# Patient Record
Sex: Female | Born: 1945 | Race: White | Hispanic: No | Marital: Married | Smoking: Never smoker
Health system: Southern US, Community
[De-identification: ages and names within clinical notes are randomized; demographics above are authoritative.]

## PROBLEM LIST (undated history)

## (undated) DIAGNOSIS — R413 Other amnesia: Secondary | ICD-10-CM

## (undated) DIAGNOSIS — F0390 Unspecified dementia without behavioral disturbance: Secondary | ICD-10-CM

## (undated) DIAGNOSIS — K5730 Diverticulosis of large intestine without perforation or abscess without bleeding: Secondary | ICD-10-CM

## (undated) DIAGNOSIS — H409 Unspecified glaucoma: Secondary | ICD-10-CM

## (undated) DIAGNOSIS — E785 Hyperlipidemia, unspecified: Secondary | ICD-10-CM

## (undated) DIAGNOSIS — J329 Chronic sinusitis, unspecified: Secondary | ICD-10-CM

## (undated) HISTORY — DX: Chronic sinusitis, unspecified: J32.9

## (undated) HISTORY — DX: Unspecified glaucoma: H40.9

## (undated) HISTORY — DX: Hyperlipidemia, unspecified: E78.5

## (undated) HISTORY — PX: NEPHRECTOMY: SHX65

## (undated) HISTORY — DX: Other amnesia: R41.3

## (undated) HISTORY — DX: Diverticulosis of large intestine without perforation or abscess without bleeding: K57.30

## (undated) HISTORY — PX: COLONOSCOPY: SHX174

---

## 2001-05-26 ENCOUNTER — Other Ambulatory Visit: Admission: RE | Admit: 2001-05-26 | Discharge: 2001-05-26 | Payer: Self-pay | Admitting: Obstetrics and Gynecology

## 2002-06-05 ENCOUNTER — Other Ambulatory Visit: Admission: RE | Admit: 2002-06-05 | Discharge: 2002-06-05 | Payer: Self-pay | Admitting: Obstetrics and Gynecology

## 2003-03-20 DIAGNOSIS — K5730 Diverticulosis of large intestine without perforation or abscess without bleeding: Secondary | ICD-10-CM

## 2003-03-20 HISTORY — DX: Diverticulosis of large intestine without perforation or abscess without bleeding: K57.30

## 2003-06-07 ENCOUNTER — Other Ambulatory Visit: Admission: RE | Admit: 2003-06-07 | Discharge: 2003-06-07 | Payer: Self-pay | Admitting: Obstetrics and Gynecology

## 2003-10-06 ENCOUNTER — Ambulatory Visit (HOSPITAL_COMMUNITY): Admission: RE | Admit: 2003-10-06 | Discharge: 2003-10-06 | Payer: Self-pay | Admitting: Gastroenterology

## 2004-06-01 ENCOUNTER — Other Ambulatory Visit: Admission: RE | Admit: 2004-06-01 | Discharge: 2004-06-01 | Payer: Self-pay | Admitting: Obstetrics and Gynecology

## 2005-06-04 ENCOUNTER — Other Ambulatory Visit: Admission: RE | Admit: 2005-06-04 | Discharge: 2005-06-04 | Payer: Self-pay | Admitting: Obstetrics & Gynecology

## 2006-06-13 ENCOUNTER — Other Ambulatory Visit: Admission: RE | Admit: 2006-06-13 | Discharge: 2006-06-13 | Payer: Self-pay | Admitting: Obstetrics & Gynecology

## 2007-06-16 ENCOUNTER — Other Ambulatory Visit: Admission: RE | Admit: 2007-06-16 | Discharge: 2007-06-16 | Payer: Self-pay | Admitting: Obstetrics and Gynecology

## 2010-08-04 NOTE — Op Note (Signed)
NAME:  Sharon Henry, Sharon Henry                        ACCOUNT NO.:  [id number]   MEDICAL RECORD NO.:  [id number]                   PATIENT TYPE:  AMB   LOCATION:  ENDO                                 FACILITY:  [facility]   PHYSICIAN:  Anselmo Rod, M.D.               DATE OF BIRTH:  08-12-1945   DATE OF PROCEDURE:  10/06/2003  DATE OF DISCHARGE:                                 OPERATIVE REPORT   PROCEDURE PERFORMED:  Screening colonoscopy.   ENDOSCOPIST:  Anselmo Rod, M.D.   INSTRUMENT USED:  Olympus video colonoscope.   INDICATION FOR PROCEDURE:  A 65 year old white female undergoing screening  colonoscopy to rule out colonic polyps, masses, etc.   PREPROCEDURE PREPARATION:  Informed consent was secured from the patient.  The patient was fasted for 8 hours prior to the procedure and prepped with a  bottle of magnesium citrate and gallon of GoLYTELY the night prior to the  procedure.   PREPROCEDURE PHYSICAL:  The patient had stable vital signs.  Neck supple.  Chest clear to auscultation.  S1, S2 regular.  Abdomen soft with normal  bowel sounds.   DESCRIPTION OF PROCEDURE:  The patient was placed in the left lateral  decubitus position, sedated with 70 mg of Demerol and 7.5 mg of  Versed in  slow incremental doses.  Once the patient was adequately sedated, maintained  on low-flow oxygen, and continuous cardiac monitoring, the Olympus video  colonoscope was advanced from the rectum to the cecum.  There was some  resistance in the right colon.  Multiple washings were done.  A small single  isolated diverticulum was seen in the sigmoid colon.  No other abnormalities  were identified.  The patient tolerated the procedure well without immediate  complications.   IMPRESSION:  1. Isolated diverticulum in the sigmoid colon (small).  2. No masses or polyps seen.   RECOMMENDATIONS:  1. Continue high-fiber diet with liberal fluid intake.  2. Repeat colonoscopy in the next 10 years unless  the patient develops any     abnormal symptoms in the interim.                                               Anselmo Rod, M.D.    JNM/MEDQ  D:  10/06/2003  T:  10/06/2003  Job:  [id number]   cc:   Laqueta Linden, M.D.  [address]  [place]  [place] [address]  Fax: [phone]

## 2010-09-01 ENCOUNTER — Encounter: Payer: Self-pay | Admitting: Internal Medicine

## 2010-09-01 ENCOUNTER — Ambulatory Visit (INDEPENDENT_AMBULATORY_CARE_PROVIDER_SITE_OTHER): Payer: BC Managed Care – PPO | Admitting: Internal Medicine

## 2010-09-01 VITALS — BP 120/64 | HR 60 | Ht 62.0 in | Wt 138.0 lb

## 2010-09-01 DIAGNOSIS — K5900 Constipation, unspecified: Secondary | ICD-10-CM | POA: Insufficient documentation

## 2010-09-01 DIAGNOSIS — R195 Other fecal abnormalities: Secondary | ICD-10-CM | POA: Insufficient documentation

## 2010-09-01 MED ORDER — PEG-KCL-NACL-NASULF-NA ASC-C 100 G PO SOLR: 1.0000 | Freq: Once | ORAL | Status: DC

## 2010-09-01 NOTE — Progress Notes (Signed)
  Subjective:    Patient ID: Sharon Henry, female    DOB: 1945/07/21, 65 y.o.   MRN: [id number]  HPI 65 year old white woman with several month history of worsening constipation with infrequent defecation and difficulty producing a stool. Prior to this she was fairly regular. She says she eats a fair amount of fiber in her diet. She has been using some bisacodyl with some relief. She has seen some bright red blood per rectum on the toilet paper at times. She had an immune fecal occult blood test that was positive. Her hemoglobin is normal. She had a colonoscopy for screening in 2005 diverticulosis. Denies any significant change in activity, medications or diet associated with this. Her TSH has been normal as well.  GI review of systems otherwise negative.  Past Medical History  Diagnosis Date  . HLD (hyperlipidemia)   . Allergic rhinitis   . Sinusitis   . Diverticulosis of sigmoid colon 2005     small    Past Surgical History  Procedure Date  . Nephrectomy     Left   . Colonoscopy 2005    diverticulosis    reports that she has never smoked. She has never used smokeless tobacco. She reports that she drinks alcohol. She reports that she does not use illicit drugs. family history includes COPD in her mother; Diabetes in her brother; Heart attack (age of onset:50) in her sister; Heart attack (age of onset:52) in her father; and Ovarian cancer in her maternal aunt.  There is no history of Colon cancer. Allergies  Allergen Reactions  . Penicillins         Review of Systems Positive for allergies some night sweats and insomnia all other review of systems negative or as per history of present illness    Objective:   Physical Exam  Constitutional: She is oriented to person, place, and time. She appears well-developed and well-nourished. No distress.  HENT:  Head: Normocephalic and atraumatic.  Mouth/Throat: Oropharynx is clear and moist.  Eyes: Conjunctivae are normal. Pupils are  equal, round, and reactive to light. No scleral icterus.  Neck: Normal range of motion. Neck supple. No thyromegaly present.  Cardiovascular: Normal rate, regular rhythm and normal heart sounds.  Exam reveals no gallop and no friction rub.   No murmur heard. Pulmonary/Chest: Effort normal and breath sounds normal.  Abdominal: Soft. Bowel sounds are normal. She exhibits no distension and no mass. There is no tenderness. There is no guarding.       No hepatosplenomegaly  Genitourinary:       Rectal deferred until colonoscopy  Musculoskeletal: She exhibits no edema.  Lymphadenopathy:    She has no cervical adenopathy.  Neurological: She is alert and oriented to person, place, and time.  Skin: Skin is warm and dry.  Psychiatric: She has a normal mood and affect. Her behavior is normal.          Assessment & Plan:

## 2010-09-01 NOTE — Assessment & Plan Note (Signed)
Most likely functional, could be diverticulosis. Colorectal neoplasia as a cause not yet excluded. Await the colonoscopy. In the meantime she can add MiraLax on a daily basis to her regimen and continue the intermittent bisacodyl. TSH is normal as are other electrolytes. She may need even more fiber in her diet, await the investigations.

## 2010-09-01 NOTE — Assessment & Plan Note (Addendum)
This is in the setting of some rectal bleeding. Also having constipation. Anorectal problems such as hemorrhoids are most likely but given the overall picture needs a colonoscopy to exclude colorectal neoplasia. Risks benefits and indications of colonoscopy are explained she understands and agrees to proceed. CBC is normal in April 2012.

## 2010-09-01 NOTE — Patient Instructions (Signed)
You have been scheduled for a Colonoscopy with separate instructions given. Your prep kit has been sent to your pharmacy for you to pick up. Please start Miralax OTC 1 dose every day in 8 oz of liquid.

## 2010-09-14 ENCOUNTER — Ambulatory Visit (AMBULATORY_SURGERY_CENTER): Payer: BC Managed Care – PPO | Admitting: Internal Medicine

## 2010-09-14 ENCOUNTER — Other Ambulatory Visit: Payer: BC Managed Care – PPO | Admitting: Internal Medicine

## 2010-09-14 ENCOUNTER — Encounter: Payer: Self-pay | Admitting: Internal Medicine

## 2010-09-14 VITALS — BP 102/59 | HR 52 | Temp 97.2°F | Resp 20 | Ht 62.0 in | Wt 138.0 lb

## 2010-09-14 DIAGNOSIS — R195 Other fecal abnormalities: Secondary | ICD-10-CM

## 2010-09-14 DIAGNOSIS — K5730 Diverticulosis of large intestine without perforation or abscess without bleeding: Secondary | ICD-10-CM

## 2010-09-14 DIAGNOSIS — K648 Other hemorrhoids: Secondary | ICD-10-CM

## 2010-09-14 MED ORDER — SODIUM CHLORIDE 0.9 % IV SOLN: 500.0000 mL | INTRAVENOUS | Status: DC

## 2010-09-14 NOTE — Progress Notes (Signed)
PT STATES SHE RUNS A LOW HEART RATE AND BP AT ALL TIMES-E [facility] RN

## 2010-09-14 NOTE — Patient Instructions (Signed)
Discharge instructions given with verbal understanding. Handouts on diverticulosis and hemorrhoids given. Resume previous medications.

## 2010-09-15 ENCOUNTER — Telehealth: Payer: Self-pay | Admitting: *Deleted

## 2010-09-15 NOTE — Telephone Encounter (Signed)

## 2012-12-17 ENCOUNTER — Other Ambulatory Visit: Payer: Self-pay

## 2012-12-19 ENCOUNTER — Other Ambulatory Visit (INDEPENDENT_AMBULATORY_CARE_PROVIDER_SITE_OTHER): Payer: Medicare Other

## 2012-12-19 DIAGNOSIS — E785 Hyperlipidemia, unspecified: Secondary | ICD-10-CM

## 2012-12-19 NOTE — Progress Notes (Signed)
Patient came in for labs obnly

## 2012-12-20 LAB — CMP14+EGFR
ALT: 22 IU/L (ref 0–32)
AST: 29 IU/L (ref 0–40)
Alkaline Phosphatase: 87 IU/L (ref 39–117)
CO2: 28 mmol/L (ref 18–29)
Calcium: 10.4 mg/dL — ABNORMAL HIGH (ref 8.6–10.2)
Chloride: 99 mmol/L (ref 97–108)
Creatinine, Ser: 0.92 mg/dL (ref 0.57–1.00)
Potassium: 4.8 mmol/L (ref 3.5–5.2)
Sodium: 141 mmol/L (ref 134–144)

## 2012-12-20 LAB — NMR, LIPOPROFILE
HDL Cholesterol by NMR: 95 mg/dL (ref >=40)
LDL Particle Number: 1689 nmol/L — ABNORMAL HIGH (ref <1000)
LDLC SERPL CALC-MCNC: 143 mg/dL — ABNORMAL HIGH (ref <100)
LP-IR Score: <25 (ref <=45)

## 2012-12-22 ENCOUNTER — Other Ambulatory Visit: Payer: Self-pay | Admitting: Nurse Practitioner

## 2012-12-22 MED ORDER — ATORVASTATIN CALCIUM 40 MG PO TABS: 40.0000 mg | ORAL_TABLET | Freq: Every day | ORAL | Status: DC

## 2012-12-24 ENCOUNTER — Ambulatory Visit: Payer: Self-pay | Admitting: Family Medicine

## 2012-12-25 ENCOUNTER — Ambulatory Visit (INDEPENDENT_AMBULATORY_CARE_PROVIDER_SITE_OTHER): Payer: Medicare Other | Admitting: Family Medicine

## 2012-12-25 ENCOUNTER — Encounter: Payer: Self-pay | Admitting: Family Medicine

## 2012-12-25 VITALS — BP 122/70 | HR 53 | Temp 97.5°F | Ht 62.0 in | Wt 151.0 lb

## 2012-12-25 DIAGNOSIS — R413 Other amnesia: Secondary | ICD-10-CM

## 2012-12-25 DIAGNOSIS — E785 Hyperlipidemia, unspecified: Secondary | ICD-10-CM

## 2012-12-25 DIAGNOSIS — R5381 Other malaise: Secondary | ICD-10-CM

## 2012-12-25 DIAGNOSIS — Z23 Encounter for immunization: Secondary | ICD-10-CM

## 2012-12-25 MED ORDER — ATORVASTATIN CALCIUM 20 MG PO TABS: 20.0000 mg | ORAL_TABLET | Freq: Every day | ORAL | Status: DC

## 2012-12-25 NOTE — Progress Notes (Signed)
  Subjective:    Patient ID: Sharon Henry, female    DOB: [birth year], 67 y.o.   MRN: [id number]  HPI  .This 67 y.o. female presents for evaluation of forgetfulness. She is accompanied by her husband who states she has been Having memory problems.  Review of Systems C/o memory problems.   No chest pain, SOB, HA, dizziness, vision change, N/V, diarrhea, constipation, dysuria, urinary urgency or frequency, myalgias, arthralgias or rash.  Objective:   Physical Exam  Vital signs noted  Well developed well nourished female.  HEENT - Head atraumatic Normocephalic                Eyes - PERRLA, Conjuctiva - clear Sclera- Clear EOMI                Ears - EAC's Wnl TM's Wnl Gross Hearing WNL                Nose - Nares patent                 Throat - oropharanx wnl Respiratory - Lungs CTA bilateral Cardiac - RRR S1 and S2 without murmur GI - Abdomen soft Nontender and bowel sounds active x 4 Extremities - No edema. Neuro - Grossly intact.  MMSE - 16 which shows moderate cognitive impairment.    Assessment & Plan:  1. Need for prophylactic vaccination and inoculation against influenza Flu shot  2. Memory loss Refer to Neurology and have her worked up.  Discussed this could be possibly dementia and will Defer any tx to neurology. - Ambulatory referral to Neurology - Thyroid Panel With TSH - Vitamin B12 - Vit D  25 hydroxy (rtn osteoporosis monitoring)  3. Other and unspecified hyperlipidemia Continue current regimen - Ambulatory referral to Neurology - atorvastatin (LIPITOR) 20 MG tablet; Take 1 tablet (20 mg total) by mouth daily.  Dispense: 90 tablet; Refill: 3  4. Other malaise and fatigue Check labs and follow up in 3 months or sooner if sx's worsen. - Ambulatory referral to Neurology - Thyroid Panel With TSH - Vitamin B12 - Vit D  25 hydroxy (rtn osteoporosis monitoring)  Deatra Canter FNP

## 2012-12-25 NOTE — Patient Instructions (Signed)
Dementia Dementia is a general term for problems with brain function. A person with dementia has memory loss and a hard time with at least one other brain function such as thinking, speaking, or problem solving. Dementia can affect social functioning, how you do your job, your mood, or your personality. The changes may be hidden for a long time. The earliest forms of this disease are usually not detected by family or friends. Dementia can be:  Irreversible.  Potentially reversible.  Partially reversible.  Progressive. This means it can get worse over time. CAUSES  Irreversible dementia causes may include:  Degeneration of brain cells (Alzheimer's disease or lewy body dementia).  Multiple small strokes (vascular dementia).  Infection (chronic meningitis or Creutzfelt-Jakob disease).  Frontotemporal dementia. This affects younger people, age 40 to 70, compared to those who have Alzheimer's disease.  Dementia associated with other disorders like Parkinson's disease, Huntington's disease, or HIV-associated dementia. Potentially or partially reversible dementia causes may include:  Medicines.  Metabolic causes such as excessive alcohol intake, vitamin B12 deficiency, or thyroid disease.  Masses or pressure in the brain such as a tumor, blood clot, or hydrocephalus. SYMPTOMS  Symptoms are often hard to detect. Family members or coworkers may not notice them early in the disease process. Different people with dementia may have different symptoms. Symptoms can include:  A hard time with memory, especially recent memory. Long-term memory may not be impaired.  Asking the same question multiple times or forgetting something someone just said.  A hard time speaking your thoughts or finding certain words.  A hard time solving problems or performing familiar tasks (such as how to use a telephone).  Sudden changes in mood.  Changes in personality, especially increasing moodiness or  mistrust.  Depression.  A hard time understanding complex ideas that were never a problem in the past. DIAGNOSIS  There are no specific tests for dementia.   Your caregiver may recommend a thorough evaluation. This is because some forms of dementia can be reversible. The evaluation will likely include a physical exam and getting a detailed history from you and a family member. The history often gives the best clues and suggestions for a diagnosis.  Memory testing may be done. A detailed brain function evaluation called neuropsychologic testing may be helpful.  Lab tests and brain imaging (such as a CT scan or MRI scan) are sometimes important.  Sometimes observation and re-evaluation over time is very helpful. TREATMENT  Treatment depends on the cause.   If the problem is a vitamin deficiency, it may be helped or cured with supplements.  For dementias such as Alzheimer's disease, medicines are available to stabilize or slow the course of the disease. There are no cures for this type of dementia.  Your caregiver can help direct you to groups, organizations, and other caregivers to help with decisions in the care of you or your loved one. HOME CARE INSTRUCTIONS The care of individuals with dementia is varied and dependent upon the progression of the dementia. The following suggestions are intended for the person living with, or caring for, the person with dementia.  Create a safe environment.  Remove the locks on bathroom doors to prevent the person from accidentally locking himself or herself in.  Use childproof latches on kitchen cabinets and any place where cleaning supplies, chemicals, or alcohol are kept.  Use childproof covers in unused electrical outlets.  Install childproof devices to keep doors and windows secured.  Remove stove knobs or install safety   knobs and an automatic shut-off on the stove.  Lower the temperature on water heaters.  Label medicines and keep them  locked up.  Secure knives, lighters, matches, power tools, and guns, and keep these items out of reach.  Keep the house free from clutter. Remove rugs or anything that might contribute to a fall.  Remove objects that might break and hurt the person.  Make sure lighting is good, both inside and outside.  Install grab rails as needed.  Use a monitoring device to alert you to falls or other needs for help.  Reduce confusion.  Keep familiar objects and people around.  Use night lights or dim lights at night.  Label items or areas.  Use reminders, notes, or directions for daily activities or tasks.  Keep a simple, consistent routine for waking, meals, bathing, dressing, and bedtime.  Create a calm, quiet environment.  Place large clocks and calendars prominently.  Display emergency numbers and home address near all telephones.  Use cues to establish different times of the day. An example is to open curtains to let the natural light in during the day.   Use effective communication.  Choose simple words and short sentences.  Use a gentle, calm tone of voice.  Be careful not to interrupt.  If the person is struggling to find a word or communicate a thought, try to provide the word or thought.  Ask one question at a time. Allow the person ample time to answer questions. Repeat the question again if the person does not respond.  Reduce nighttime restlessness.  Provide a comfortable bed.  Have a consistent nighttime routine.  Ensure a regular walking or physical activity schedule. Involve the person in daily activities as much as possible.  Limit napping during the day.  Limit caffeine.  Attend social events that stimulate rather than overwhelm the senses.  Encourage good nutrition and hydration.  Reduce distractions during meal times and snacks.  Avoid foods that are too hot or too cold.  Monitor chewing and swallowing ability.  Continue with routine vision,  hearing, dental, and medical screenings.  Only give over-the-counter or prescription medicines as directed by the caregiver.  Monitor driving abilities. Do not allow the person to drive when safe driving is no longer possible.  Register with an identification program which could provide location assistance in the event of a missing person situation. SEEK MEDICAL CARE IF:   New behavioral problems start such as moodiness, aggressiveness, or seeing things that are not there (hallucinations).  Any new problem with brain function happens. This includes problems with balance, speech, or falling a lot.  Problems with swallowing develop.  Any symptoms of other illness happen. Small changes or worsening in any aspect of brain function can be a sign that the illness is getting worse. It can also be a sign of another medical illness such as infection. Seeing a caregiver right away is important. SEEK IMMEDIATE MEDICAL CARE IF:   A fever develops.  New or worsened confusion develops.  New or worsened sleepiness develops.  Staying awake becomes hard to do. Document Released: 08/29/2000 Document Revised: 05/28/2011 Document Reviewed: 07/31/2010 [employer] Patient Information 2014 [employer].  

## 2012-12-26 LAB — THYROID PANEL WITH TSH
Free Thyroxine Index: 2.2 (ref 1.2–4.9)
T3 Uptake Ratio: 30 % (ref 24–39)
T4, Total: 7.2 ug/dL (ref 4.5–12.0)
TSH: 1.63 u[IU]/mL (ref 0.450–4.500)

## 2012-12-26 LAB — VITAMIN B12: Vitamin B-12: 1085 pg/mL — ABNORMAL HIGH (ref 211–946)

## 2012-12-26 LAB — VITAMIN D 25 HYDROXY (VIT D DEFICIENCY, FRACTURES): Vit D, 25-Hydroxy: 69.9 ng/mL (ref 30.0–100.0)

## 2013-01-01 ENCOUNTER — Encounter: Payer: Self-pay | Admitting: *Deleted

## 2013-01-05 ENCOUNTER — Telehealth: Payer: Self-pay | Admitting: Family Medicine

## 2013-01-07 ENCOUNTER — Encounter: Payer: Self-pay | Admitting: Neurology

## 2013-01-07 ENCOUNTER — Ambulatory Visit (INDEPENDENT_AMBULATORY_CARE_PROVIDER_SITE_OTHER): Payer: Medicare Other | Admitting: Neurology

## 2013-01-07 ENCOUNTER — Encounter (INDEPENDENT_AMBULATORY_CARE_PROVIDER_SITE_OTHER): Payer: Self-pay

## 2013-01-07 VITALS — BP 134/71 | HR 53 | Ht 62.0 in | Wt 151.0 lb

## 2013-01-07 DIAGNOSIS — R413 Other amnesia: Secondary | ICD-10-CM

## 2013-01-07 HISTORY — DX: Other amnesia: R41.3

## 2013-01-07 MED ORDER — DONEPEZIL HCL 5 MG PO TABS: 5.0000 mg | ORAL_TABLET | Freq: Every day | ORAL | Status: DC
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
## 2013-01-07 NOTE — Patient Instructions (Signed)
Began Aricept (donepezil) at 5 mg at night for one month. If this medication is well-tolerated, please call our office and we will call in a prescription for the 10 mg tablets. Look out for side effects that may include nausea, diarrhea, weight loss, or stomach cramps. This medication will also cause a runny nose, therefore there is no need for allergy medications for this purpose.    

## 2013-01-21 ENCOUNTER — Ambulatory Visit
Admission: RE | Admit: 2013-01-21 | Discharge: 2013-01-21 | Disposition: A | Discharge status: Home / Self Care | Payer: Medicare Other | Source: Ambulatory Visit | Attending: Neurology | Admitting: Neurology

## 2013-01-21 ENCOUNTER — Telehealth: Payer: Self-pay | Admitting: Neurology

## 2013-01-21 DIAGNOSIS — R413 Other amnesia: Secondary | ICD-10-CM

## 2013-01-21 NOTE — Telephone Encounter (Signed)
I called the patient. The MRI of the brain was normal. 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

## 2013-01-26 ENCOUNTER — Telehealth: Payer: Self-pay | Admitting: Neurology

## 2013-01-26 MED ORDER — DONEPEZIL HCL 10 MG PO TABS: 10.0000 mg | ORAL_TABLET | Freq: Every day | ORAL | Status: DC

## 2013-01-26 NOTE — Telephone Encounter (Signed)
Spoke with husband and he said wife has been on Aricept 5mg  for about 3 weeks now without any side effects.  He wanted to know if the doctor could increase it to 10mg  as discussed in appointment.  [phone]  He also wanted me to document that the patients son Lady Saucier is allowed access to the patient's medical information.  I told him we would mail a HIPAA form for him to fill out and return to the office.

## 2013-01-26 NOTE — Telephone Encounter (Signed)
I called the patient. I talked with the husband. I will go up on the aricept to the 10 mg dose. His son, Lady Saucier, wants to talk to [place], and the husband indicates that it is OK to do so.

## 2013-03-23 ENCOUNTER — Ambulatory Visit (INDEPENDENT_AMBULATORY_CARE_PROVIDER_SITE_OTHER): Payer: Medicare HMO | Admitting: General Practice

## 2013-03-23 VITALS — BP 111/65 | HR 92 | Temp 97.6°F | Ht 62.0 in | Wt 141.0 lb

## 2013-03-23 DIAGNOSIS — R0989 Other specified symptoms and signs involving the circulatory and respiratory systems: Secondary | ICD-10-CM

## 2013-03-23 DIAGNOSIS — J101 Influenza due to other identified influenza virus with other respiratory manifestations: Secondary | ICD-10-CM

## 2013-03-23 DIAGNOSIS — J111 Influenza due to unidentified influenza virus with other respiratory manifestations: Secondary | ICD-10-CM

## 2013-03-23 LAB — POCT INFLUENZA A/B
INFLUENZA A, POC: POSITIVE
INFLUENZA B, POC: NEGATIVE

## 2013-03-23 MED ORDER — OSELTAMIVIR PHOSPHATE 75 MG PO CAPS: 75.0000 mg | ORAL_CAPSULE | Freq: Two times a day (BID) | ORAL | Status: DC

## 2013-03-23 NOTE — Progress Notes (Signed)
   Subjective:    Patient ID: Sharon Henry, female    DOB: [birth year], 68 y.o.   MRN: [id number]  Cough This is a new problem. The current episode started in the past 7 days. The problem has been gradually improving. The cough is non-productive. Associated symptoms include nasal congestion. Pertinent negatives include no chest pain, chills, fever, postnasal drip, sore throat, shortness of breath or wheezing. The symptoms are aggravated by lying down. She has tried OTC cough suppressant for the symptoms. There is no history of asthma, bronchitis or pneumonia.      Review of Systems  Constitutional: Negative for fever and chills.  HENT: Positive for congestion. Negative for postnasal drip, sinus pressure and sore throat.   Respiratory: Positive for cough. Negative for chest tightness, shortness of breath and wheezing.   Cardiovascular: Negative for chest pain and palpitations.  All other systems reviewed and are negative.       Objective:   Physical Exam  Constitutional: She is oriented to person, place, and time. She appears well-developed and well-nourished.  HENT:  Head: Normocephalic and atraumatic.  Right Ear: External ear normal.  Left Ear: External ear normal.  Nose: Nose normal.  Mouth/Throat: Oropharynx is clear and moist.  Eyes: Pupils are equal, round, and reactive to light.  Neck: Normal range of motion. Neck supple. No thyromegaly present.  Cardiovascular: Normal rate, regular rhythm and normal heart sounds.   Pulmonary/Chest: Effort normal and breath sounds normal. No respiratory distress. She exhibits no tenderness.  Lymphadenopathy:    She has no cervical adenopathy.  Neurological: She is alert and oriented to person, place, and time.  Skin: Skin is warm and dry.  Psychiatric: She has a normal mood and affect.     Results for orders placed in visit on 03/23/13  POCT INFLUENZA A/B      Result Value Range   Influenza A, POC Positive     Influenza B, POC  Negative          Assessment & Plan:  1. Chest congestion  - POCT Influenza A/B  2. Influenza A  - oseltamivir (TAMIFLU) 75 MG capsule; Take 1 capsule (75 mg total) by mouth 2 (two) times daily.  Dispense: 10 capsule; Refill: 0 -increase fluids -motrin or tylenol as directed -RTO if symptoms worsen, may seek emergency medical treatment -Patient verbalized understanding Coralie KeensMae E. Shaylee Stanislawski, FNP-C

## 2013-03-23 NOTE — Patient Instructions (Signed)

## 2013-05-08 ENCOUNTER — Telehealth: Payer: Self-pay | Admitting: *Deleted

## 2013-05-08 NOTE — Telephone Encounter (Signed)
Patient husband call about wife medication.

## 2013-05-11 NOTE — Telephone Encounter (Signed)
Spoke to patient's husband and he said that he doesn't see a change with the Aricept.  I explained that it won't make her better but help keep her stable.  He understood and will keep her follow up for 07-15-13, unless he sees a noticeable change.

## 2013-06-25 ENCOUNTER — Other Ambulatory Visit: Payer: Self-pay | Admitting: *Deleted

## 2013-06-25 DIAGNOSIS — E785 Hyperlipidemia, unspecified: Secondary | ICD-10-CM

## 2013-06-25 MED ORDER — ATORVASTATIN CALCIUM 20 MG PO TABS: 20.0000 mg | ORAL_TABLET | Freq: Every day | ORAL | Status: DC

## 2013-07-13 ENCOUNTER — Ambulatory Visit: Payer: Medicare Other | Admitting: Nurse Practitioner

## 2013-07-15 ENCOUNTER — Encounter (INDEPENDENT_AMBULATORY_CARE_PROVIDER_SITE_OTHER): Payer: Self-pay

## 2013-07-15 ENCOUNTER — Encounter: Payer: Self-pay | Admitting: Nurse Practitioner

## 2013-07-15 ENCOUNTER — Ambulatory Visit (INDEPENDENT_AMBULATORY_CARE_PROVIDER_SITE_OTHER): Payer: Medicare HMO | Admitting: Nurse Practitioner

## 2013-07-15 VITALS — BP 118/53 | HR 47 | Ht 63.0 in | Wt 141.0 lb

## 2013-07-15 DIAGNOSIS — R413 Other amnesia: Secondary | ICD-10-CM

## 2013-07-15 MED ORDER — MEMANTINE HCL ER 28 MG PO CP24: 28.0000 mg | ORAL_CAPSULE | Freq: Every day | ORAL | Status: DC
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
## 2013-07-15 NOTE — Patient Instructions (Signed)
Continue continue Aricept at 10 mg Begin Namenda starter pack 1 month free Rx Followup in 6 months

## 2013-07-15 NOTE — Progress Notes (Signed)
I have read the note, and I agree with the clinical assessment and plan.  Joanette Silveria K Alaynah Schutter   

## 2013-09-05 ENCOUNTER — Other Ambulatory Visit: Payer: Self-pay | Admitting: Nurse Practitioner

## 2013-09-07 NOTE — Telephone Encounter (Signed)
Patient's husband calling--patient needs refill called in for generic Namenda--going out of town tomorrow for 12 days--[facility] [place]

## 2013-12-21 ENCOUNTER — Ambulatory Visit (INDEPENDENT_AMBULATORY_CARE_PROVIDER_SITE_OTHER): Payer: Medicare HMO

## 2013-12-21 ENCOUNTER — Telehealth: Payer: Self-pay | Admitting: Family Medicine

## 2013-12-21 DIAGNOSIS — Z23 Encounter for immunization: Secondary | ICD-10-CM

## 2013-12-21 NOTE — Telephone Encounter (Signed)
Dr Christell ConstantMoore isn't taking new patients. Discussed with patient and her husband. Appt scheduled with Dr Hyacinth MeekerMiller for 10/14. They are aware.

## 2013-12-28 ENCOUNTER — Other Ambulatory Visit: Payer: Self-pay

## 2013-12-28 MED ORDER — DONEPEZIL HCL 10 MG PO TABS: 10.0000 mg | ORAL_TABLET | Freq: Every day | ORAL | Status: DC

## 2013-12-30 ENCOUNTER — Encounter: Payer: Self-pay | Admitting: Family Medicine

## 2013-12-30 ENCOUNTER — Encounter (INDEPENDENT_AMBULATORY_CARE_PROVIDER_SITE_OTHER): Payer: Self-pay

## 2013-12-30 ENCOUNTER — Ambulatory Visit (INDEPENDENT_AMBULATORY_CARE_PROVIDER_SITE_OTHER): Payer: Medicare HMO | Admitting: Family Medicine

## 2013-12-30 VITALS — BP 128/59 | HR 46 | Temp 97.6°F | Ht 63.0 in | Wt 127.0 lb

## 2013-12-30 DIAGNOSIS — R413 Other amnesia: Secondary | ICD-10-CM

## 2013-12-30 DIAGNOSIS — Z23 Encounter for immunization: Secondary | ICD-10-CM

## 2013-12-30 NOTE — Progress Notes (Signed)
   Subjective:    Patient ID: Eulas PostLinda M Castiglia, female    DOB: 16-Feb-1946, 68 y.o.   MRN: [id number]  HPI   Patient Active Problem List   Diagnosis Date Noted  . Memory deficit 01/07/2013  . Heme positive stool, immune test 09/01/2010  . Constipation 09/01/2010   Outpatient Encounter Prescriptions as of 12/30/2013  Medication Sig  . Coenzyme Q10 (COQ10 PO) Take by mouth daily.    Marland Kitchen. donepezil (ARICEPT) 10 MG tablet Take 1 tablet (10 mg total) by mouth at bedtime.  . Loratadine-Pseudoephedrine (CLARITIN-D 12 HOUR PO) Take by mouth. As needed   . Multiple Vitamin (MULTIVITAMIN PO) Take by mouth daily.    Marland Kitchen. NAMENDA XR 28 MG CP24 TAKE ONE CAPSULE BY MOUTH ONCE DAILY  . vitamin B-12 (CYANOCOBALAMIN) 1000 MCG tablet Take 1,000 mcg by mouth daily.    . Bisacodyl (LAXATIVE PO) Take by mouth. Vegetable laxative as needed  . [DISCONTINUED] atorvastatin (LIPITOR) 20 MG tablet Take 1 tablet (20 mg total) by mouth daily.  . [DISCONTINUED] oseltamivir (TAMIFLU) 75 MG capsule Take 75 mg by mouth 2 (two) times daily. PRN only      Review of Systems     Objective:   Physical Exam BP 128/59  Pulse 46  Temp(Src) 97.6 F (36.4 C) (Oral)  Ht 5\' 3"  (1.6 m)  Wt 127 lb (57.607 kg)  BMI 22.50 kg/m2        Assessment & Plan:

## 2013-12-30 NOTE — Progress Notes (Signed)
   Subjective:    Patient ID: Sharon Henry M Haak, female    DOB: Nov 11, 1945, 68 y.o.   MRN: [id number]  HPI 68 year old female who is here to followup memory loss. Her husband accompanies her today. He is her primary caregiver and stays with her most of the time. There've been no histories of wandering. She does not drive any longer. We did some simple memory test today. She was unable to her member 3 words at 5 minutes, was unable to draw a clock, was unable to remember her birth year,. Her memory issues started about 2 years ago per the husband. I explained to him that if this is Alzheimer's, it is a fairly aggressive form of dementia with 8-10 year prognosis. I think this is important for him to know for planning purposes.    Review of Systems  Psychiatric/Behavioral: Positive for confusion and decreased concentration.       Objective:   Physical Exam  Constitutional: She appears well-developed.  HENT:  Head: Normocephalic.  Eyes: Pupils are equal, round, and reactive to light.  Cardiovascular: Normal rate and regular rhythm.   Pulmonary/Chest: Effort normal.  Abdominal: Soft.  Musculoskeletal: Normal range of motion.  Neurological: She is alert.  Mini cog failed When talking to pr, she seems aware but when family involved in conversation, her dementia is easily confirmed    BP 128/59  Pulse 46  Temp(Src) 97.6 F (36.4 C) (Oral)  Ht 5\' 3"  (1.6 m)  Wt 127 lb (57.607 kg)  BMI 22.50 kg/m2      Assessment & Plan:  1. Memory deficit Suspect Alzheimer's.  Discussed D/C Aricept; may continue Namenda for now as it helps behaviors sometimes.  Had MRI at neuro office; no vascular changes - Folate B12  Frederica KusterStephen M Jamacia Jester MD - Vitamin B12

## 2013-12-31 LAB — FOLATE: Folate: 12.8 ng/mL (ref >3.0)

## 2013-12-31 LAB — VITAMIN B12: Vitamin B-12: >1999 pg/mL — ABNORMAL HIGH (ref 211–946)

## 2014-01-04 ENCOUNTER — Other Ambulatory Visit: Payer: Self-pay | Admitting: *Deleted

## 2014-01-07 ENCOUNTER — Telehealth: Payer: Self-pay | Admitting: *Deleted

## 2014-01-07 NOTE — Telephone Encounter (Signed)
Message copied by Almeta MonasSTONE, JANIE M on Thu Jan 07, 2014  9:39 AM ------      Message from: Frederica KusterMILLER, STEPHEN M      Created: Wed Jan 06, 2014  8:08 PM       Lab results are negative, eg no cause for dementia ------

## 2014-01-07 NOTE — Telephone Encounter (Signed)
Aware. 

## 2014-01-12 ENCOUNTER — Ambulatory Visit: Payer: Medicare HMO | Admitting: Nurse Practitioner

## 2014-01-25 ENCOUNTER — Other Ambulatory Visit: Payer: Self-pay | Admitting: Family Medicine

## 2014-01-25 MED ORDER — DONEPEZIL HCL 10 MG PO TABS: 10.0000 mg | ORAL_TABLET | Freq: Every day | ORAL | Status: DC

## 2014-01-25 NOTE — Telephone Encounter (Signed)
Message left , script sent in to pharmacy.

## 2014-03-07 ENCOUNTER — Other Ambulatory Visit: Payer: Self-pay | Admitting: Neurology

## 2014-03-08 ENCOUNTER — Other Ambulatory Visit: Payer: Self-pay | Admitting: Family Medicine

## 2014-03-08 NOTE — Telephone Encounter (Signed)
Patient cancelled last appt noting: Patient (per patient's husband, patient is seeing another doctor and does not wish to r/s )

## 2014-03-08 NOTE — Telephone Encounter (Signed)
Was this DC'd, read last notes?

## 2014-03-09 MED ORDER — MEMANTINE HCL ER 28 MG PO CP24: 1.0000 | ORAL_CAPSULE | Freq: Every day | ORAL | Status: DC

## 2014-03-09 NOTE — Telephone Encounter (Signed)
I was finally able to reach patient.  They said this Rx has been filled by new provider.  Asked that we disregard the refill request.

## 2014-03-09 NOTE — Telephone Encounter (Signed)
Had recommended D/C'oing Aricept but continue namenda

## 2014-06-15 LAB — HM MAMMOGRAPHY

## 2014-07-07 ENCOUNTER — Other Ambulatory Visit (INDEPENDENT_AMBULATORY_CARE_PROVIDER_SITE_OTHER): Payer: Medicare HMO

## 2014-07-07 DIAGNOSIS — I10 Essential (primary) hypertension: Secondary | ICD-10-CM

## 2014-07-07 DIAGNOSIS — E785 Hyperlipidemia, unspecified: Secondary | ICD-10-CM

## 2014-07-07 NOTE — Progress Notes (Signed)
Lab only 

## 2014-07-08 LAB — CMP14+EGFR
ALT: 15 IU/L (ref 0–32)
AST: 21 IU/L (ref 0–40)
Albumin/Globulin Ratio: 2.1 (ref 1.1–2.5)
Albumin: 4.1 g/dL (ref 3.6–4.8)
Alkaline Phosphatase: 85 IU/L (ref 39–117)
BUN/Creatinine Ratio: 24 (ref 11–26)
BUN: 21 mg/dL (ref 8–27)
Bilirubin Total: <0.2 mg/dL (ref 0.0–1.2)
CALCIUM: 9.6 mg/dL (ref 8.7–10.3)
CO2: 21 mmol/L (ref 18–29)
CREATININE: 0.89 mg/dL (ref 0.57–1.00)
Chloride: 102 mmol/L (ref 97–108)
GFR calc Af Amer: 78 mL/min/{1.73_m2} (ref >59)
GFR calc non Af Amer: 68 mL/min/{1.73_m2} (ref >59)
GLOBULIN, TOTAL: 2 g/dL (ref 1.5–4.5)
Glucose: 100 mg/dL — ABNORMAL HIGH (ref 65–99)
Potassium: 5.1 mmol/L (ref 3.5–5.2)
Sodium: 140 mmol/L (ref 134–144)
TOTAL PROTEIN: 6.1 g/dL (ref 6.0–8.5)

## 2014-07-08 LAB — LIPID PANEL
CHOL/HDL RATIO: 2.1 ratio (ref 0.0–4.4)
CHOLESTEROL TOTAL: 196 mg/dL (ref 100–199)
HDL: 92 mg/dL (ref >39)
LDL Calculated: 93 mg/dL (ref 0–99)
Triglycerides: 53 mg/dL (ref 0–149)
VLDL CHOLESTEROL CAL: 11 mg/dL (ref 5–40)

## 2014-07-09 ENCOUNTER — Ambulatory Visit: Payer: Medicare HMO | Admitting: Family Medicine

## 2014-07-13 ENCOUNTER — Ambulatory Visit (INDEPENDENT_AMBULATORY_CARE_PROVIDER_SITE_OTHER): Payer: Medicare HMO | Admitting: Family Medicine

## 2014-07-13 ENCOUNTER — Encounter: Payer: Self-pay | Admitting: Family Medicine

## 2014-07-13 VITALS — BP 104/61 | HR 60 | Temp 97.3°F | Ht 63.0 in | Wt 133.0 lb

## 2014-07-13 DIAGNOSIS — R413 Other amnesia: Secondary | ICD-10-CM

## 2014-07-13 MED ORDER — MEMANTINE HCL 10 MG PO TABS: 10.0000 mg | ORAL_TABLET | Freq: Two times a day (BID) | ORAL | Status: DC

## 2014-07-13 MED ORDER — MEMANTINE HCL ER 28 MG PO CP24: 28.0000 mg | ORAL_CAPSULE | Freq: Every day | ORAL | Status: DC

## 2014-07-13 NOTE — Progress Notes (Signed)
   Subjective:    Patient ID: Sharon Henry, female    DOB: 27-Feb-1946, 69 y.o.   MRN: [id number]  HPI 69 year old female here to follow-up memory loss. She probably has Alzheimer's. She is starting to exhibit exhibited behaviors such as hiding money and some paranoid behavior. She does not cook or clean her drive. Husband has a housekeeper come in for cleaning. She still walks up to 5 miles a day and rides motorcycle with her husband. There are no issues with incontinence, anorexia, dysphagia. Chief Complaint  Patient presents with  . Memory Loss    6 month f/u     Patient Active Problem List   Diagnosis Date Noted  . Memory deficit 01/07/2013  . Heme positive stool, immune test 09/01/2010  . Constipation 09/01/2010   Outpatient Encounter Prescriptions as of 07/13/2014  Medication Sig  . cholecalciferol (VITAMIN D) 1000 UNITS tablet Take 1,000 Units by mouth daily.  . COD LIVER OIL PO Take by mouth.  . Coenzyme Q10 (COQ10 PO) Take by mouth daily.    . Memantine HCl ER (NAMENDA XR) 28 MG CP24 Take 28 mg by mouth daily.  . Multiple Vitamin (MULTIVITAMIN PO) Take by mouth daily.    . Omega-3 Fatty Acids (FISH OIL PO) Take 2 capsules by mouth daily.  . vitamin B-12 (CYANOCOBALAMIN) 1000 MCG tablet Take 1,000 mcg by mouth daily.    . [DISCONTINUED] Bisacodyl (LAXATIVE PO) Take by mouth. Vegetable laxative as needed  . [DISCONTINUED] Loratadine-Pseudoephedrine (CLARITIN-D 12 HOUR PO) Take by mouth. As needed        Review of Systems  Constitutional: Negative.   Respiratory: Negative.   Cardiovascular: Negative.   Gastrointestinal: Negative.   Neurological: Negative.   Psychiatric/Behavioral: Positive for confusion and decreased concentration.       Objective:   Physical Exam  Constitutional: She appears well-developed and well-nourished.  Cardiovascular: Normal rate and regular rhythm.   Pulmonary/Chest: Effort normal and breath sounds normal.  Abdominal: Soft. Bowel  sounds are normal.  Neurological: She is alert.  Psychiatric:  Behaviors are acceptable but thought content and judgment is lacking   BP 104/61 mmHg  Pulse 60  Temp(Src) 97.3 F (36.3 C) (Oral)  Ht 5\' 3"  (1.6 m)  Wt 133 lb (60.328 kg)  BMI 23.57 kg/m2        Assessment & Plan:  1. Memory deficit Memory deficit is slowly progressing we'll continue with Namenda but change to 10 mg twice a day. Labs were reviewed and all are good including lipids and metabolic panel  Frederica KusterStephen M Miller MD

## 2014-07-13 NOTE — Addendum Note (Signed)
Addended by: Gwenith DailyHUDY, Kyriakos Babler N on: 07/13/2014 09:13 AM   Modules accepted: Orders

## 2014-07-13 NOTE — Progress Notes (Signed)
Called [facility] to cancel script for Namenda XR and called in Namenda 10mg  1 BID #60 with 5 refills.

## 2014-07-22 ENCOUNTER — Encounter: Payer: Self-pay | Admitting: Nurse Practitioner

## 2014-09-13 ENCOUNTER — Encounter: Payer: Self-pay | Admitting: *Deleted

## 2014-11-29 ENCOUNTER — Encounter (INDEPENDENT_AMBULATORY_CARE_PROVIDER_SITE_OTHER): Payer: Self-pay

## 2014-11-29 ENCOUNTER — Encounter: Payer: Self-pay | Admitting: Family Medicine

## 2014-11-29 ENCOUNTER — Ambulatory Visit (INDEPENDENT_AMBULATORY_CARE_PROVIDER_SITE_OTHER): Payer: Commercial Managed Care - HMO | Admitting: Family Medicine

## 2014-11-29 VITALS — BP 116/75 | HR 65 | Temp 98.1°F | Ht 63.0 in | Wt 130.2 lb

## 2014-11-29 DIAGNOSIS — L309 Dermatitis, unspecified: Secondary | ICD-10-CM

## 2014-11-29 DIAGNOSIS — A4901 Methicillin susceptible Staphylococcus aureus infection, unspecified site: Secondary | ICD-10-CM | POA: Diagnosis not present

## 2014-11-29 MED ORDER — FLUOCINONIDE-E 0.05 % EX CREA: 1.0000 "application " | TOPICAL_CREAM | Freq: Two times a day (BID) | CUTANEOUS | Status: DC

## 2014-11-29 MED ORDER — CIPROFLOXACIN HCL 500 MG PO TABS: 500.0000 mg | ORAL_TABLET | Freq: Two times a day (BID) | ORAL | Status: DC

## 2014-11-29 NOTE — Progress Notes (Signed)
Subjective:  Patient ID: Sharon Henry, female    DOB: 03-08-1946  Age: 69 y.o. MRN: [id number]  CC: Rash   HPI Sharon Henry presents for rash on forearm. Husband states she has dementia and history is given by him. He states that she has been rubbing on her forearm. It's been present for several days. The rash is presumed to be pruritic although denied by the patient. It started out as too little blisters one near the wrist and the other about halfway up the dorsal forearm. Redness spread between them. She has been applying hydrogen peroxide several times a day.  History Sharon Henry has a past medical history of HLD (hyperlipidemia); Allergic rhinitis; Sinusitis; Diverticulosis of sigmoid colon (2005 ); Memory deficit (01/07/2013); and Glaucoma.   She has past surgical history that includes Nephrectomy and Colonoscopy (2005;09/14/10).   Her family history includes COPD in her mother; Diabetes in her brother; Heart attack (age of onset: 52) in her sister; Heart attack (age of onset: 42) in her father; Ovarian cancer in her maternal aunt. There is no history of Colon cancer.She reports that she has never smoked. She has never used smokeless tobacco. She reports that she does not drink alcohol or use illicit drugs.  Outpatient Prescriptions Prior to Visit  Medication Sig Dispense Refill  . cholecalciferol (VITAMIN D) 1000 UNITS tablet Take 1,000 Units by mouth daily.    . COD LIVER OIL PO Take by mouth.    . Coenzyme Q10 (COQ10 PO) Take by mouth daily.      . memantine (NAMENDA) 10 MG tablet Take 1 tablet (10 mg total) by mouth 2 (two) times daily. 60 tablet 5  . Multiple Vitamin (MULTIVITAMIN PO) Take by mouth daily.      . vitamin B-12 (CYANOCOBALAMIN) 1000 MCG tablet Take 1,000 mcg by mouth daily.      . Omega-3 Fatty Acids (FISH OIL PO) Take 2 capsules by mouth daily.     No facility-administered medications prior to visit.    ROS Review of Systems  Objective:  BP 116/75 mmHg   Pulse 65  Temp(Src) 98.1 F (36.7 C) (Oral)  Ht  (1.6 m)  Wt 130 lb 3.2 oz (59.058 kg)  BMI 23.07 kg/m2  BP Readings from Last 3 Encounters:  11/29/14 116/75  07/13/14 104/61  12/30/13 128/59    Wt Readings from Last 3 Encounters:  11/29/14 130 lb 3.2 oz (59.058 kg)  07/13/14 133 lb (60.328 kg)  12/30/13 127 lb (57.607 kg)     Physical Exam  Constitutional: She is oriented to person, place, and time. She appears well-developed and well-nourished. No distress.  HENT:  Head: Normocephalic and atraumatic.  Eyes: Conjunctivae are normal. Pupils are equal, round, and reactive to light.  Neck: Normal range of motion. Neck supple. No thyromegaly present.  Cardiovascular: Normal rate, regular rhythm and normal heart sounds.   No murmur heard. Pulmonary/Chest: Effort normal and breath sounds normal. No respiratory distress. She has no wheezes. She has no rales.  Abdominal: Soft. Bowel sounds are normal. She exhibits no distension. There is no tenderness.  Musculoskeletal: Normal range of motion.  Lymphadenopathy:    She has no cervical adenopathy.  Neurological: She is alert and oriented to person, place, and time.  Skin: Skin is warm and dry. Rash (4 cm X 10 cm erythematous eruption with lichenification along the dorsal right forearm from the risk to approximately one third proximal portion. There is a crusted scab of 4 mm  distally and a crusted vesicular-appearing scab 8 mm proximally) noted.  Psychiatric: She has a normal mood and affect. Her behavior is normal. Judgment and thought content normal.    No results found for: HGBA1C  Lab Results  Component Value Date   GLUCOSE 100* 07/07/2014   CHOL 196 07/07/2014   TRIG 53 07/07/2014   HDL 92 07/07/2014   LDLCALC 93 07/07/2014   ALT 15 07/07/2014   AST 21 07/07/2014   NA 140 07/07/2014   K 5.1 07/07/2014   CL 102 07/07/2014   CREATININE 0.89 07/07/2014   BUN 21 07/07/2014   CO2 21 07/07/2014   TSH 1.630 12/25/2012      Mr Brain Wo Contrast  01/21/2013   [facility]  NEUROIMAGING REPORT   STUDY DATE: 01/21/13 PATIENT NAME: Sharon Henry DOB: 1945-12-26 MRN: [id number]  ORDERING CLINICIAN: York Spaniel, MD  CLINICAL HISTORY: 69 year old female with memory loss.  EXAM: MRI brain (without)  TECHNIQUE: MRI of the brain without contrast was obtained utilizing 5 mm  axial slices with T1, T2, T2 flair, SWI and diffusion weighted views.  T1  sagittal and T2 coronal views were obtained. CONTRAST: no IMAGING SITE: [employer] [facility] (3 Tesla MRI)   FINDINGS:  No abnormal lesions are seen on diffusion-weighted views to suggest acute  ischemia. The cortical sulci, fissures and cisterns are notable for  slightly enlarged mildly enlarged left sylvian fissure. Lateral, third and  fourth ventricle are normal in size and appearance. No extra-axial fluid  collections are seen. No evidence of mass effect or midline shift.    On sagittal views the posterior fossa, pituitary gland and corpus callosum  are unremarkable. No evidence of intracranial hemorrhage on SWI views. The  orbits and their contents, paranasal sinuses and calvarium are  unremarkable.  Intracranial flow voids are present.    01/21/2013   Normal MRI brain (without).   INTERPRETING PHYSICIAN:  Suanne Marker, MD Certified in Neurology, Neurophysiology and Neuroimaging  [facility] [address], [address] [place], [place] [address] [phone]    Assessment & Plan:   Sharon Henry was seen today for rash.  Diagnoses and all orders for this visit:  Eczema  Staph aureus infection  Other orders -     ciprofloxacin (CIPRO) 500 MG tablet; Take 1 tablet (500 mg total) by mouth 2 (two) times daily. -     fluocinonide-emollient (LIDEX-E) 0.05 % cream; Apply 1 application topically 2 (two) times daily.   I have discontinued Sharon Henry Omega-3 Fatty Acids (FISH OIL PO). I am also having her start on ciprofloxacin  and fluocinonide-emollient. Additionally, I am having her maintain her Multiple Vitamin (MULTIVITAMIN PO), Coenzyme Q10 (COQ10 PO), vitamin B-12, COD LIVER OIL PO, cholecalciferol, and memantine.  Meds ordered this encounter  Medications  . ciprofloxacin (CIPRO) 500 MG tablet    Sig: Take 1 tablet (500 mg total) by mouth 2 (two) times daily.    Dispense:  14 tablet    Refill:  0  . fluocinonide-emollient (LIDEX-E) 0.05 % cream    Sig: Apply 1 application topically 2 (two) times daily.    Dispense:  60 g    Refill:  0     Follow-up: Return if symptoms worsen or fail to improve and in one month for regularly scheduled checkup.Mechele Claude, M.D.

## 2014-12-15 ENCOUNTER — Ambulatory Visit (INDEPENDENT_AMBULATORY_CARE_PROVIDER_SITE_OTHER): Payer: Commercial Managed Care - HMO | Admitting: *Deleted

## 2014-12-15 ENCOUNTER — Other Ambulatory Visit: Payer: Commercial Managed Care - HMO

## 2014-12-15 DIAGNOSIS — Z23 Encounter for immunization: Secondary | ICD-10-CM | POA: Diagnosis not present

## 2014-12-17 ENCOUNTER — Ambulatory Visit: Payer: Commercial Managed Care - HMO

## 2014-12-20 ENCOUNTER — Ambulatory Visit (INDEPENDENT_AMBULATORY_CARE_PROVIDER_SITE_OTHER): Payer: Commercial Managed Care - HMO | Admitting: Pediatrics

## 2014-12-20 ENCOUNTER — Ambulatory Visit (INDEPENDENT_AMBULATORY_CARE_PROVIDER_SITE_OTHER): Payer: Commercial Managed Care - HMO

## 2014-12-20 ENCOUNTER — Encounter: Payer: Self-pay | Admitting: Pediatrics

## 2014-12-20 VITALS — Temp 97.5°F | Ht 63.0 in | Wt 127.6 lb

## 2014-12-20 DIAGNOSIS — R413 Other amnesia: Secondary | ICD-10-CM | POA: Diagnosis not present

## 2014-12-20 DIAGNOSIS — Z0000 Encounter for general adult medical examination without abnormal findings: Secondary | ICD-10-CM | POA: Diagnosis not present

## 2014-12-20 DIAGNOSIS — Z780 Asymptomatic menopausal state: Secondary | ICD-10-CM

## 2014-12-20 DIAGNOSIS — Z23 Encounter for immunization: Secondary | ICD-10-CM

## 2014-12-20 NOTE — Progress Notes (Signed)
Subjective:   Sharon Henry is a 69 y.o. female who presents for a Medicare Annual Wellness Visit.  Son and daughterinlaw live nearby.  Recognizes family members, knows her husband, gets upset  Husband describes her as taking care of a 5yo child, have to redirect often. She can bathe herself but will be in shower for 5 seconds if he isnt redirecting what she needs to do. Does fine with toileting on her own.  Behavior is usually fine as long as husband is around. Sleeps well. Appetite is good. Dementia ongoing past 3-4 years. Stable for last 1 year or so. Riding around on [place] with husband. Husband feels like he is managing all right now. Have family in the area but they have not been offering much help.They do have friends occasionally that can help watch her. Sometimes she will get upset though if he isnt around. He is doing all of the cooking, cleaning, laundry, finances. Normal bowel movements. Walking with husband 5-6 days a week. Thye used to go jogging. Husband is not working now. No vision changes, no joint pains.   Review of Systems  All other systems negative other than what is in HPI  Current Medications (verified) Outpatient Encounter Prescriptions as of 12/20/2014  Medication Sig  . aspirin 81 MG tablet Take 81 mg by mouth daily.  . cholecalciferol (VITAMIN D) 1000 UNITS tablet Take 1,000 Units by mouth daily.  . Coenzyme Q10 (COQ10 PO) Take by mouth daily.    . folic acid (FOLVITE) 400 MCG tablet Take 400 mcg by mouth daily.  . memantine (NAMENDA) 10 MG tablet Take 1 tablet (10 mg total) by mouth 2 (two) times daily.  . Multiple Vitamin (MULTIVITAMIN PO) Take by mouth daily.    . vitamin B-12 (CYANOCOBALAMIN) 1000 MCG tablet Take 1,000 mcg by mouth daily.    . [DISCONTINUED] ciprofloxacin (CIPRO) 500 MG tablet Take 1 tablet (500 mg total) by mouth 2 (two) times daily.  . [DISCONTINUED] COD LIVER OIL PO Take by mouth.  . [DISCONTINUED] fluocinonide-emollient  (LIDEX-E) 0.05 % cream Apply 1 application topically 2 (two) times daily. (Patient not taking: Reported on 12/20/2014)   No facility-administered encounter medications on file as of 12/20/2014.    Allergies (verified) Penicillins   History: Past Medical History  Diagnosis Date  . HLD (hyperlipidemia)   . Allergic rhinitis   . Sinusitis   . Diverticulosis of sigmoid colon 2005     small   . Memory deficit 01/07/2013  . Glaucoma    Past Surgical History  Procedure Laterality Date  . Nephrectomy      Left   . Colonoscopy  2005;09/14/10    diverticulosis; diverticulosis and hemorrhoids   Family History  Problem Relation Age of Onset  . Heart attack Father 24  . Heart attack Sister 30  . Diabetes Brother   . Ovarian cancer Maternal Aunt   . COPD Mother   . Colon cancer Neg Hx    Social History   Occupational History  . 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 Retired     Social History Main Topics  . Smoking status: Never Smoker   . Smokeless tobacco: Never Used  . Alcohol Use: No     Comment: One beer a week  . Drug Use: No  . Sexual Activity: Not on file    Do you feel safe at home?  Yes  Dietary issues and exercise activities: Current Exercise Habits:: Home exercise routine, Type  of exercise: walking, Time (Minutes): > 60, Frequency (Times/Week): > 6, Weekly Exercise (Minutes/Week): 0, Intensity: Mild  Current Dietary habits: healthy diet, balanced  Objective:    Today's Vitals   12/20/14 1056  Temp: 97.5 F (36.4 C)  TempSrc: Oral  Height:  (1.6 m)  Weight: 127 lb 9.6 oz (57.879 kg)   Body mass index is 22.61 kg/(m^2).  Activities of Daily Living In your present state of health, do you have any difficulty performing the following activities: 12/20/2014  Hearing? N  Vision? N  Difficulty concentrating or making decisions? Y  Walking or climbing stairs? N  Dressing or bathing? Y  Doing errands, shopping? Y  Preparing Food and eating ? N  Using the  Toilet? N  In the past six months, have you accidently leaked urine? N  Do you have problems with loss of bowel control? N  Managing your Medications? Y  Managing your Finances? Y  Housekeeping or managing your Housekeeping? Y    Are there smokers in your home (other than you)? No   Cardiac Risk Factors include: advanced age (>78men, >27 women);hypertension  Depression Screen PHQ 2/9 Scores 12/20/2014 12/20/2014 11/29/2014  PHQ - 2 Score 3 0 0  PHQ- 9 Score 11 - -    Fall Risk Fall Risk  12/20/2014 11/29/2014  Falls in the past year? No No    Cognitive Function: MMSE - Mini Mental State Exam 12/20/2014  Orientation to time 0  Orientation to Place 0  Registration 3  Attention/ Calculation 0  Recall 0  Language- name 2 objects 2  Language- repeat 0  Language- follow 3 step command 3  Language- read & follow direction 1  Write a sentence 0  Copy design 0  Total score 9    Immunizations and Health Maintenance Immunization History  Administered Date(s) Administered  . Influenza,inj,Quad PF,36+ Mos 12/25/2012, 12/21/2013, 12/15/2014  . Pneumococcal Conjugate-13 12/30/2013   There are no preventive care reminders to display for this patient.  Patient Care Team: Frederica Kuster, MD as PCP - General (Family Medicine)  Indicate any recent Medical Services you may have received from other than [facility] providers in the past year (date may be approximate).    Assessment:    Annual Wellness Visit    Screening Tests Health Maintenance  Topic Date Due  . DEXA SCAN  12/29/2014 (Originally 1946-02-01)  . ZOSTAVAX  12/29/2014 (Originally 01/12/2008)  . TETANUS/TDAP  12/29/2014 (Originally 01/12/1967)  . Hepatitis C Screening  12/29/2014 (Originally 03/10/1946)  . PNA vac Low Risk Adult (2 of 2 - PPSV23) 12/31/2014  . MAMMOGRAM  06/15/2015  . INFLUENZA VACCINE  10/18/2015  . COLONOSCOPY  09/13/2020        Plan:   During the course of the visit Chidera was educated and  counseled about the following appropriate screening and preventive services:   Vaccines to include Pneumoccal 23, received flu last week  Colorectal cancer screening--UTD  Cardiovascular disease screening--UTD lipid panel, no symptoms  Diabetes screening--UTD  Bone Denisty / Osteoporosis Screening--ordered today  Mammogram--UTD  PAP--no history of abnormals  Nutrition counseling--completed  Advanced Directives--husband is HCPOA.     Goals    None     MMSE 9 today. Memory has been stable on namenda  BID per husband. No worsening. Husband will let us know if things are getting overwhelming at home for him as he is sole care giver.  Patient Instructions (the written plan) were given to the patient.  Johna Sheriff, MD   12/20/2014

## 2015-03-23 ENCOUNTER — Ambulatory Visit (INDEPENDENT_AMBULATORY_CARE_PROVIDER_SITE_OTHER): Payer: Commercial Managed Care - HMO | Admitting: Pediatrics

## 2015-03-23 ENCOUNTER — Encounter: Payer: Self-pay | Admitting: Pediatrics

## 2015-03-23 VITALS — BP 100/69 | HR 82 | Temp 97.6°F | Ht 63.0 in | Wt 128.2 lb

## 2015-03-23 DIAGNOSIS — R413 Other amnesia: Secondary | ICD-10-CM

## 2015-03-23 MED ORDER — MEMANTINE HCL 10 MG PO TABS: 10.0000 mg | ORAL_TABLET | Freq: Two times a day (BID) | ORAL | Status: DC

## 2015-03-23 NOTE — Progress Notes (Signed)
    Subjective:    Patient ID: Sharon Henry, female    DOB: [birth year], 70 y.o.   MRN: [id number]  CC: Follow-up memory problems  HPI: Sharon Henry is a 70 y.o. female presenting for Follow-up  Living with her husband Sometimes flushing things inappropriately down toilet, such as dinner of beans Husband frequently unstopping No other family memebrs helping regularly Continues to walk 5 miles a day most days No falls, no recent illness Husband has looked into memory units, but for now cost is prohibitive Wonders if needs to continue the namenda. Is cheaper when taking 10mg  BID Takes Ca and Vit D regularly   Relevant past medical, surgical, family and social history reviewed and updated as indicated. Interim medical history since our last visit reviewed. Allergies and medications reviewed and updated.    ROS: Per HPI unless specifically indicated above  History  Smoking status  . Never Smoker   Smokeless tobacco  . Never Used    Past Medical History Patient Active Problem List   Diagnosis Date Noted  . Memory deficit 01/07/2013  . Heme positive stool, immune test 09/01/2010  . Constipation 09/01/2010    Current Outpatient Prescriptions  Medication Sig Dispense Refill  . aspirin 81 MG tablet Take 81 mg by mouth daily.    . cholecalciferol (VITAMIN D) 1000 UNITS tablet Take 1,000 Units by mouth daily.    . Coenzyme Q10 (COQ10 PO) Take by mouth daily.      . folic acid (FOLVITE) 400 MCG tablet Take 400 mcg by mouth daily.    . memantine (NAMENDA) 10 MG tablet Take 1 tablet (10 mg total) by mouth 2 (two) times daily. 180 tablet 2  . Multiple Vitamin (MULTIVITAMIN PO) Take by mouth daily.      . vitamin B-12 (CYANOCOBALAMIN) 1000 MCG tablet Take 1,000 mcg by mouth daily.       No current facility-administered medications for this visit.       Objective:    BP 100/69 mmHg  Pulse 82  Temp(Src) 97.6 F (36.4 C) (Oral)  Ht 5\' 3"  (1.6 m)  Wt 128 lb 3.2 oz  (58.151 kg)  BMI 22.72 kg/m2  Wt Readings from Last 3 Encounters:  03/23/15 128 lb 3.2 oz (58.151 kg)  12/20/14 127 lb 9.6 oz (57.879 kg)  11/29/14 130 lb 3.2 oz (59.058 kg)    Gen: NAD, alert, cooperative with exam, NCAT EYES: EOMI, no scleral injection or icterus ENT:  OP without erythema LYMPH: no cervical LAD CV: NRRR, normal S1/S2, no murmur, distal pulses 2+ b/l Resp: CTABL, no wheezes, normal WOB Abd: +BS, soft, NTND. Ext: No edema, warm Neuro: Alert, answers questions mostly appropriately, repeating back whatever was recently said in conversation     Assessment & Plan:    Sharon Henry was seen today for follow-up dementia and memory problems. Discussed with husband namenda can help slow progression of dementia, will not reverse it. If it is affordable it is a good idea to continue. She hasnt had it the past two weeks and he says he does not notice a difference. WIll refill namenda. RTC 6 mo for f/u.  Diagnoses and all orders for this visit:  Memory deficit -     memantine (NAMENDA) 10 MG tablet; Take 1 tablet (10 mg total) by mouth 2 (two) times daily.    Follow up plan: Return in about 6 months (around 09/20/2015).  Rex Krasarol Vincent, MD [facility] 03/23/2015, 10:23 AM

## 2015-03-30 ENCOUNTER — Other Ambulatory Visit: Payer: Self-pay | Admitting: *Deleted

## 2015-03-30 DIAGNOSIS — R413 Other amnesia: Secondary | ICD-10-CM

## 2015-03-31 DIAGNOSIS — R413 Other amnesia: Secondary | ICD-10-CM | POA: Diagnosis not present

## 2015-03-31 NOTE — Addendum Note (Signed)
Addended by: Monica BectonHODGES, Elza Sortor F on: 03/31/2015 09:38 AM   Modules accepted: Orders

## 2015-04-01 LAB — TSH: TSH: 1.5 u[IU]/mL (ref 0.450–4.500)

## 2015-05-17 ENCOUNTER — Telehealth: Payer: Self-pay | Admitting: *Deleted

## 2015-05-17 DIAGNOSIS — R413 Other amnesia: Secondary | ICD-10-CM

## 2015-05-17 NOTE — Telephone Encounter (Signed)
Pt husband wonders if you can try adding Aricept to Namenda.  Memory some worse.  Hold for [employer]

## 2015-05-18 MED ORDER — DONEPEZIL HCL 5 MG PO TABS: 5.0000 mg | ORAL_TABLET | Freq: Every day | ORAL | Status: DC

## 2015-05-18 NOTE — Telephone Encounter (Signed)
Patient would like to try Aricept for a few months.  Does she need to stop taking Namenda

## 2015-05-18 NOTE — Telephone Encounter (Signed)
She should take both the aricept and the namenda. They work differently and there is some evidence that together it can slow progression more than namenda alone, but again, not reverse anything. Aricept we should start at 5 mg. If she does well with that we can increase to 10 milligrams after 4 weeks. I sent  tabs of aricept in to pharmacy. Call me in 4 weeks, let me know if having any side effects, if not we can increase to .

## 2015-05-18 NOTE — Telephone Encounter (Signed)
Yes it is something we can do. It wont reverse anything lik ewe talked about, the goal is to slow progression of memory loss and dementia. Does he want to give it a try for a few months? Let me know, I can call it in. Or if he has more questions I'm happy to call and talk about it too.

## 2015-05-30 NOTE — Telephone Encounter (Signed)
Sharon Henry aware

## 2015-08-10 ENCOUNTER — Other Ambulatory Visit: Payer: Self-pay | Admitting: Pediatrics

## 2015-08-10 DIAGNOSIS — R413 Other amnesia: Secondary | ICD-10-CM

## 2015-08-10 MED ORDER — DONEPEZIL HCL 5 MG PO TABS: 5.0000 mg | ORAL_TABLET | Freq: Every day | ORAL | Status: DC

## 2015-08-10 NOTE — Telephone Encounter (Signed)
done

## 2015-08-30 ENCOUNTER — Ambulatory Visit (INDEPENDENT_AMBULATORY_CARE_PROVIDER_SITE_OTHER): Payer: Commercial Managed Care - HMO | Admitting: Family Medicine

## 2015-08-30 ENCOUNTER — Encounter: Payer: Self-pay | Admitting: Family Medicine

## 2015-08-30 VITALS — BP 104/64 | HR 59 | Temp 97.6°F | Ht 63.0 in | Wt 126.4 lb

## 2015-08-30 DIAGNOSIS — G309 Alzheimer's disease, unspecified: Secondary | ICD-10-CM | POA: Diagnosis not present

## 2015-08-30 DIAGNOSIS — F0280 Dementia in other diseases classified elsewhere without behavioral disturbance: Secondary | ICD-10-CM

## 2015-08-30 DIAGNOSIS — F0281 Dementia in other diseases classified elsewhere with behavioral disturbance: Secondary | ICD-10-CM | POA: Insufficient documentation

## 2015-08-30 DIAGNOSIS — F02818 Dementia in other diseases classified elsewhere, unspecified severity, with other behavioral disturbance: Secondary | ICD-10-CM | POA: Insufficient documentation

## 2015-08-30 NOTE — Progress Notes (Signed)
Subjective:    Patient ID: Sharon Henry, female    DOB: 1946-02-15, 70 y.o.   MRN: [id number]  HPI 70 year old female who is here for annual exam. She has had cognitive decline for the past 4-5 years but according to her husband even before that time there were problems with her driving. He has her primary caregiver but he has found some help that allows him some time for himself. As before a stress importance of this to avoid caregiver burnout. Even though her memory loss is progressing at a fairly rapid pace she is still functional within the home. She eats good. There is no dysphagia. Weight has been maintained. There are no issues with falling. She continues to walk 5-6 miles a day with her husband. I think that we should label her Alzheimer's dementia rather than memory deficit. That is based on the aggressive nature of her cognitive decline.  Patient Active Problem List   Diagnosis Date Noted  . Memory deficit 01/07/2013  . Heme positive stool, immune test 09/01/2010  . Constipation 09/01/2010   Outpatient Encounter Prescriptions as of 08/30/2015  Medication Sig  . aspirin 81 MG tablet Take 81 mg by mouth daily.  . cholecalciferol (VITAMIN D) 1000 UNITS tablet Take 1,000 Units by mouth daily.  . Coenzyme Q10 (COQ10 PO) Take by mouth daily.    Marland Kitchen donepezil (ARICEPT) 5 MG tablet Take 1 tablet (5 mg total) by mouth at bedtime.  . folic acid (FOLVITE) 400 MCG tablet Take 400 mcg by mouth daily.  . memantine (NAMENDA) 10 MG tablet Take 1 tablet (10 mg total) by mouth 2 (two) times daily.  . Multiple Vitamin (MULTIVITAMIN PO) Take by mouth daily.    . vitamin B-12 (CYANOCOBALAMIN) 1000 MCG tablet Take 1,000 mcg by mouth daily.     No facility-administered encounter medications on file as of 08/30/2015.      Review of Systems  Constitutional: Negative.   HENT: Negative.   Respiratory: Negative.   Cardiovascular: Negative.   Gastrointestinal: Negative.   Neurological: Negative.     Psychiatric/Behavioral: Positive for confusion.       Objective:   Physical Exam  Constitutional: She appears well-developed and well-nourished.  HENT:  Head: Normocephalic.  Cardiovascular: Regular rhythm.   Pulmonary/Chest: Effort normal and breath sounds normal.  Musculoskeletal: Normal range of motion.  Neurological: She is alert.  Patient is not oriented to time or place. She speaks in one or 2 word answers. There is no spontaneous conversation. She is able to follow simple orders such as walk to the door turn around and come back to the table. She is oblivious to most things going on around her including discussion with her husband regarding her symptoms.   BP 104/64 mmHg  Pulse 59  Temp(Src) 97.6 F (36.4 C) (Oral)  Ht  (1.6 m)  Wt 126 lb 6.4 oz (57.335 kg)  BMI 22.40 kg/m2        Assessment & Plan:  1. Alzheimer's dementia without behavioral disturbance She has a normal exam except for cognitive decline. We talked about medicines. I have seen Namenda helping behavioral issues and when it is withdrawn behaviors worsen so I would recommend stopping the Aricept but continuing Namenda. We talked about lab work. I do not think it will change how we are dealing with her problem and I really do not see a need for labs at this time. I shared with her husband that I think the last testing and  intervention we do, the better   Frederica KusterStephen M Miller MD

## 2015-09-06 ENCOUNTER — Telehealth: Payer: Self-pay | Admitting: Family Medicine

## 2015-09-06 NOTE — Telephone Encounter (Signed)
Pt is scheduled for mammogram.

## 2015-11-09 ENCOUNTER — Encounter: Payer: Commercial Managed Care - HMO | Admitting: *Deleted

## 2015-11-15 ENCOUNTER — Other Ambulatory Visit: Payer: Self-pay | Admitting: Pediatrics

## 2015-11-15 DIAGNOSIS — R413 Other amnesia: Secondary | ICD-10-CM

## 2015-11-15 MED ORDER — DONEPEZIL HCL 5 MG PO TABS: 5.0000 mg | ORAL_TABLET | Freq: Every day | ORAL | 0 refills | Status: DC

## 2015-11-15 MED ORDER — MEMANTINE HCL 10 MG PO TABS: 10.0000 mg | ORAL_TABLET | Freq: Two times a day (BID) | ORAL | 2 refills | Status: DC

## 2015-11-15 NOTE — Telephone Encounter (Signed)
Rx for namenda sent to pharmacy

## 2015-11-15 NOTE — Telephone Encounter (Signed)
Patient aware rx sent to pharmacy.  

## 2015-12-01 ENCOUNTER — Ambulatory Visit (INDEPENDENT_AMBULATORY_CARE_PROVIDER_SITE_OTHER): Payer: Commercial Managed Care - HMO | Admitting: Pediatrics

## 2015-12-01 ENCOUNTER — Encounter: Payer: Self-pay | Admitting: Pediatrics

## 2015-12-01 VITALS — BP 99/63 | HR 56 | Temp 99.5°F | Ht 63.0 in | Wt 131.6 lb

## 2015-12-01 DIAGNOSIS — G309 Alzheimer's disease, unspecified: Secondary | ICD-10-CM

## 2015-12-01 DIAGNOSIS — Z111 Encounter for screening for respiratory tuberculosis: Secondary | ICD-10-CM

## 2015-12-01 DIAGNOSIS — F0280 Dementia in other diseases classified elsewhere without behavioral disturbance: Secondary | ICD-10-CM

## 2015-12-01 DIAGNOSIS — Z7689 Persons encountering health services in other specified circumstances: Secondary | ICD-10-CM | POA: Diagnosis not present

## 2015-12-01 NOTE — Progress Notes (Signed)
  Subjective:   Patient ID: Sharon Henry, female    DOB: 1945-10-05, 70 y.o.   MRN: [id number] CC: PPD Reading and forms  HPI: Sharon Henry is a 70 y.o. female presenting for PPD Reading and forms  Here with her husband Husband does most of talking Ptanswers simple questions when prompted by husband [facility] [place], will be respite for husband Needs forms filled out Pt has continued walking 5-6 miles a day with her husband No worsening of symptoms per husband Not able to leave her with anyone, her sister included, because she requires a great deal of redirection, does better with husband than other people per him Continues to take namenda and aricept  Relevant past medical, surgical, family and social history reviewed. Allergies and medications reviewed and updated. History  Smoking Status  . Never Smoker  Smokeless Tobacco  . Never Used   ROS: Per HPI   Objective:    BP 99/63   Pulse (!) 56   Temp 99.5 F (37.5 C) (Oral)   Ht 5\' 3"  (1.6 m)   Wt 131 lb 9.6 oz (59.7 kg)   BMI 23.31 kg/m   Wt Readings from Last 3 Encounters:  12/01/15 131 lb 9.6 oz (59.7 kg)  08/30/15 126 lb 6.4 oz (57.3 kg)  03/23/15 128 lb 3.2 oz (58.2 kg)    Gen: NAD, alert, cooperative with exam EYES: EOMI, no conjunctival injection, or no icterus ENT:  TMs pearly gray b/l, OP without erythema LYMPH: no cervical LAD CV: NRRR, normal S1/S2, no murmur, distal pulses 2+ b/l Resp: CTABL, no wheezes, normal WOB Abd: +BS, soft, NTND. no guarding or organomegaly Ext: No edema, warm Neuro: Alert, answers simple questions yes and no when correct answer is prompted by hsuband, repeats short phrases after him about what thye have been doing recently  Assessment & Plan:  Sharon Henry was seen today for ppd reading and forms.  Diagnoses and all orders for this visit:  Encounter for PPD skin test reading -     PPD  Alzheimer's dementia without behavioral  disturbance Stable Continue meds [facility] for several hours a week Husband positive about what he has learned about center so far, thinks it will be good experience/opportunity for them both Paperwork filled out  Follow up plan: As needed Rex Krasarol Jamacia Jester, MD [facility]

## 2015-12-02 DIAGNOSIS — G309 Alzheimer's disease, unspecified: Secondary | ICD-10-CM | POA: Diagnosis not present

## 2015-12-02 DIAGNOSIS — Z7689 Persons encountering health services in other specified circumstances: Secondary | ICD-10-CM | POA: Diagnosis not present

## 2015-12-05 ENCOUNTER — Ambulatory Visit: Payer: Commercial Managed Care - HMO | Admitting: *Deleted

## 2015-12-05 DIAGNOSIS — Z111 Encounter for screening for respiratory tuberculosis: Secondary | ICD-10-CM

## 2015-12-05 LAB — TB SKIN TEST
INDURATION: 0 mm
TB Skin Test: NEGATIVE

## 2015-12-05 NOTE — Progress Notes (Signed)
PPD read neg Copy given to pt

## 2015-12-14 ENCOUNTER — Ambulatory Visit (INDEPENDENT_AMBULATORY_CARE_PROVIDER_SITE_OTHER): Payer: Commercial Managed Care - HMO

## 2015-12-14 DIAGNOSIS — Z23 Encounter for immunization: Secondary | ICD-10-CM

## 2015-12-20 ENCOUNTER — Other Ambulatory Visit: Payer: Self-pay | Admitting: Pediatrics

## 2015-12-20 DIAGNOSIS — R413 Other amnesia: Secondary | ICD-10-CM

## 2015-12-20 MED ORDER — MEMANTINE HCL 10 MG PO TABS: 10.0000 mg | ORAL_TABLET | Freq: Two times a day (BID) | ORAL | 2 refills | Status: DC

## 2015-12-20 NOTE — Telephone Encounter (Signed)
Refill sent.

## 2016-02-21 ENCOUNTER — Other Ambulatory Visit: Payer: Self-pay | Admitting: Family Medicine

## 2016-02-21 DIAGNOSIS — R413 Other amnesia: Secondary | ICD-10-CM

## 2016-02-21 MED ORDER — DONEPEZIL HCL 5 MG PO TABS: 5.0000 mg | ORAL_TABLET | Freq: Every day | ORAL | 0 refills | Status: DC

## 2016-02-21 NOTE — Telephone Encounter (Signed)
Refill sent per protocol. Last visit 11/2015

## 2016-02-21 NOTE — Telephone Encounter (Signed)
done

## 2016-05-28 ENCOUNTER — Encounter: Payer: Self-pay | Admitting: Pediatrics

## 2016-05-28 ENCOUNTER — Ambulatory Visit (INDEPENDENT_AMBULATORY_CARE_PROVIDER_SITE_OTHER): Payer: Medicare HMO | Admitting: Pediatrics

## 2016-05-28 ENCOUNTER — Ambulatory Visit: Payer: Commercial Managed Care - HMO | Admitting: Pediatrics

## 2016-05-28 VITALS — BP 103/60 | HR 60 | Temp 97.0°F | Ht 63.0 in | Wt 133.0 lb

## 2016-05-28 DIAGNOSIS — F0281 Dementia in other diseases classified elsewhere with behavioral disturbance: Secondary | ICD-10-CM | POA: Diagnosis not present

## 2016-05-28 DIAGNOSIS — G308 Other Alzheimer's disease: Secondary | ICD-10-CM

## 2016-05-28 DIAGNOSIS — G309 Alzheimer's disease, unspecified: Principal | ICD-10-CM

## 2016-05-28 NOTE — Progress Notes (Signed)
  Subjective:   Patient ID: Sharon Henry, female    DOB: 07-28-1945, 71 y.o.   MRN: [id number] CC: Follow-up  HPI: Sharon PostLinda M Henry is a 71 y.o. female presenting for Follow-up  Dementia: Getting overwhelming for husband Can't be left alone Picking nose all the time Wipes her face with saliva Leaves the stove alone now Will take and hide things left out of counter (dinner, dishes, meat out of sink that is thawing) Going around the house turning on and off lights Will get the dirty clothes out of washing machine put away Can't be left along for any period of time Follows husband around throughout the day Refusing baths Not using toilet paper, using rags and throwing into her closet  Eating well Usually uses toilet, rarely goes in the middle of the floor Usually follows husband around thoughout the day Usually knows her name and her husband's name 80% of time Doesn't always know "where she is living", says she wants to go live with different family member  Brushes teeth with bar of soap if not getting direction No sundowning, no agitation, just lieks ot be on the move most of the time per husband She will lie down and sleep when he does, gets up when he does Taking clothes out of drawers, putting on floor in closet Sneaks around to move things, not doing it in front of husbabnd  Knows kids' faces, not their names Puts 4 bras on at a time sometimes  Relevant past medical, surgical, family and social history reviewed. Allergies and medications reviewed and updated. History  Smoking Status  . Never Smoker  Smokeless Tobacco  . Never Used   ROS: Per HPI   Objective:    BP 103/60   Pulse 60   Temp 97 F (36.1 C) (Oral)   Ht 5\' 3"  (1.6 m)   Wt 133 lb (60.3 kg)   BMI 23.56 kg/m   Wt Readings from Last 3 Encounters:  05/28/16 133 lb (60.3 kg)  12/01/15 131 lb 9.6 oz (59.7 kg)  08/30/15 126 lb 6.4 oz (57.3 kg)    Gen: NAD, alert, cooperative with exam, NCAT EYES:  EOMI, no conjunctival injection, or no icterus ENT:  TMs pearly gray b/l, OP without erythema LYMPH: no cervical LAD CV: NRRR, normal S1/S2, no murmur, distal pulses 2+ b/l Resp: CTABL, no wheezes, normal WOB Abd: +BS, soft, NTND. no guarding or organomegaly Ext: No edema, warm Neuro: Alert and oriented, strength equal b/l UE and LE, follows simple directions, like come sit over here MSK: normal muscle bulk  Assessment & Plan:  Sharon Henry was seen today for follow-up multiple med problems.  Diagnoses and all orders for this visit:  Alzheimer's dementia with behavioral disturbance, unspecified timing of dementia onset Husband has been looking into NF, her behavior is getting overwhelming at home for him to care for She wanders when not in familiar situations, needs help with baths, brushing teeth Needs direction for some other ADLs Appetite has been good Filled out paperwork with husband today for nursing facility admission, he is looking at local facilities, she is needing increasing care and supervision at home with dementia  Follow up plan: 3 mo, sooner if needed Rex Krasarol Shadasia Oldfield, MD [facility]

## 2016-06-07 ENCOUNTER — Telehealth: Payer: Self-pay | Admitting: Family Medicine

## 2016-06-07 DIAGNOSIS — R413 Other amnesia: Secondary | ICD-10-CM

## 2016-06-07 MED ORDER — DONEPEZIL HCL 5 MG PO TABS: 5.0000 mg | ORAL_TABLET | Freq: Every day | ORAL | 1 refills | Status: DC

## 2016-06-07 NOTE — Telephone Encounter (Signed)
done

## 2016-06-07 NOTE — Telephone Encounter (Signed)
What is the name of the medication? donepezil (ARICEPT) 5 MG tablet  Have you contacted your pharmacy to request a refill? yes  Which pharmacy would you like this sent to? [facility]   Patient notified that their request is being sent to the clinical staff for review and that they should receive a call once it is complete. If they do not receive a call within 24 hours they can check with their pharmacy or our office.

## 2016-08-29 ENCOUNTER — Encounter: Payer: Self-pay | Admitting: Pediatrics

## 2016-08-29 ENCOUNTER — Ambulatory Visit (INDEPENDENT_AMBULATORY_CARE_PROVIDER_SITE_OTHER): Payer: Medicare HMO | Admitting: Pediatrics

## 2016-08-29 VITALS — BP 93/52 | HR 62 | Temp 96.8°F | Ht 63.0 in | Wt 137.0 lb

## 2016-08-29 DIAGNOSIS — E538 Deficiency of other specified B group vitamins: Secondary | ICD-10-CM | POA: Diagnosis not present

## 2016-08-29 DIAGNOSIS — R413 Other amnesia: Secondary | ICD-10-CM

## 2016-08-29 DIAGNOSIS — Z5181 Encounter for therapeutic drug level monitoring: Secondary | ICD-10-CM

## 2016-08-29 DIAGNOSIS — G309 Alzheimer's disease, unspecified: Secondary | ICD-10-CM

## 2016-08-29 DIAGNOSIS — F0281 Dementia in other diseases classified elsewhere with behavioral disturbance: Secondary | ICD-10-CM

## 2016-08-29 DIAGNOSIS — Z13220 Encounter for screening for lipoid disorders: Secondary | ICD-10-CM

## 2016-08-29 MED ORDER — DONEPEZIL HCL 5 MG PO TABS: 5.0000 mg | ORAL_TABLET | Freq: Every day | ORAL | 1 refills | Status: DC

## 2016-08-29 MED ORDER — MEMANTINE HCL 10 MG PO TABS: 10.0000 mg | ORAL_TABLET | Freq: Two times a day (BID) | ORAL | 2 refills | Status: DC
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
## 2016-08-30 LAB — LIPID PANEL
Chol/HDL Ratio: 2.7 ratio (ref 0.0–4.4)
Cholesterol, Total: 169 mg/dL (ref 100–199)
HDL: 63 mg/dL (ref >39)
LDL Calculated: 90 mg/dL (ref 0–99)
Triglycerides: 80 mg/dL (ref 0–149)
VLDL CHOLESTEROL CAL: 16 mg/dL (ref 5–40)

## 2016-08-30 LAB — CMP14+EGFR
ALBUMIN: 4.3 g/dL (ref 3.6–4.8)
ALK PHOS: 88 IU/L (ref 39–117)
ALT: 14 IU/L (ref 0–32)
AST: 18 IU/L (ref 0–40)
Albumin/Globulin Ratio: 2 (ref 1.2–2.2)
BUN / CREAT RATIO: 21 (ref 12–28)
BUN: 20 mg/dL (ref 8–27)
Bilirubin Total: 0.3 mg/dL (ref 0.0–1.2)
CO2: 25 mmol/L (ref 20–29)
CREATININE: 0.97 mg/dL (ref 0.57–1.00)
Calcium: 9.8 mg/dL (ref 8.7–10.3)
Chloride: 102 mmol/L (ref 96–106)
GFR calc Af Amer: 69 mL/min/{1.73_m2} (ref >59)
GFR calc non Af Amer: 60 mL/min/{1.73_m2} (ref >59)
GLUCOSE: 102 mg/dL — AB (ref 65–99)
Globulin, Total: 2.1 g/dL (ref 1.5–4.5)
Potassium: 4.1 mmol/L (ref 3.5–5.2)
Sodium: 141 mmol/L (ref 134–144)
TOTAL PROTEIN: 6.4 g/dL (ref 6.0–8.5)

## 2016-08-30 LAB — VITAMIN B12: Vitamin B-12: 1206 pg/mL (ref 232–1245)

## 2016-09-04 ENCOUNTER — Ambulatory Visit (INDEPENDENT_AMBULATORY_CARE_PROVIDER_SITE_OTHER): Payer: Medicare HMO | Admitting: *Deleted

## 2016-09-04 VITALS — BP 100/65 | HR 62 | Temp 97.0°F | Ht 63.0 in | Wt 138.8 lb

## 2016-09-04 DIAGNOSIS — Z0000 Encounter for general adult medical examination without abnormal findings: Secondary | ICD-10-CM

## 2016-09-04 DIAGNOSIS — Z1239 Encounter for other screening for malignant neoplasm of breast: Secondary | ICD-10-CM

## 2016-09-04 NOTE — Progress Notes (Addendum)
Subjective:   Sharon Henry is a 71 y.o. female who presents for an Initial Medicare Annual Wellness Visit. She is accompanied today by her husband of 37 years who provides all of medical history at this time.  Patient has been diagnosed with dementia for 7 years.  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  She currently lives at home with her husband. She has one son and 4 grand kids that visits occasionally.  Sharon Henry and her husband walks for approximately 2 hours a day and eat 3 meals that consist of cereals, biscuits, and sandwiches.  Sharon Henry attend church every Sunday along with her husband.  Sharon Henry has not been hospitalized or had any surgeries in the past year.  Overall Mr. Piercey states that he feels that his wife's health is about the same as it was a year ago.      Objective:    Today's Vitals   09/04/16 1555  BP: 100/65  Pulse: 62  Temp: 97 F (36.1 C)  TempSrc: Oral  Weight: 138 lb 12.8 oz (63 kg)  Height: 5\' 3"  (1.6 m)   Body mass index is 24.59 kg/m.   Current Medications (verified) Outpatient Encounter Prescriptions as of 09/04/2016  Medication Sig  . aspirin 81 MG tablet Take 81 mg by mouth daily.  . cholecalciferol (VITAMIN D) 1000 UNITS tablet Take 1,000 Units by mouth daily.  . Coenzyme Q10 (COQ10 PO) Take by mouth daily.    Marland Kitchen donepezil (ARICEPT) 5 MG tablet Take 1 tablet (5 mg total) by mouth at bedtime.  . folic acid (FOLVITE) 400 MCG tablet Take 400 mcg by mouth daily.  . memantine (NAMENDA) 10 MG tablet Take 1 tablet (10 mg total) by mouth 2 (two) times daily.  . Multiple Vitamin (MULTIVITAMIN PO) Take by mouth daily.    . vitamin B-12 (CYANOCOBALAMIN) 1000 MCG tablet Take 1,000 mcg by mouth daily.     No facility-administered encounter medications on file as of 09/04/2016.     Allergies (verified) Penicillins   History: Past Medical History:  Diagnosis Date  . Allergic rhinitis   . Diverticulosis of sigmoid  colon 2005    small   . Glaucoma   . HLD (hyperlipidemia)   . Memory deficit 01/07/2013  . Sinusitis    Past Surgical History:  Procedure Laterality Date  . COLONOSCOPY  2005;09/14/10   diverticulosis; diverticulosis and hemorrhoids  . NEPHRECTOMY     Left    Family History  Problem Relation Age of Onset  . Heart attack Father 91  . COPD Mother   . Heart attack Sister 56  . Diabetes Brother   . Ovarian cancer Maternal Aunt   . Colon cancer Neg Hx    Social History   Occupational History  . 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 Retired     Social History Main Topics  . Smoking status: Never Smoker  . Smokeless tobacco: Never Used  . Alcohol use No     Comment: One beer a week  . Drug use: No  . Sexual activity: No    Tobacco Counseling Never Smoker  Activities of Daily Living In your present state of health, do you have any difficulty performing the following activities: 09/04/2016  Hearing? N  Vision? Y  Difficulty concentrating or making decisions? Y  Walking or climbing stairs? N  Dressing or bathing? Y  Doing errands, shopping? Y  Some recent data might  be hidden  Memory Deficit,  Patient is unable to perform ADLs alone.   Immunizations and Health Maintenance Immunization History  Administered Date(s) Administered  . Influenza, High Dose Seasonal PF 12/14/2015  . Influenza,inj,Quad PF,36+ Mos 12/25/2012, 12/21/2013, 12/15/2014  . PPD Test 12/02/2015  . Pneumococcal Conjugate-13 12/30/2013  . Pneumococcal Polysaccharide-23 06/11/202016   Health Maintenance Due  Topic Date Due  . TETANUS/TDAP  01/12/1967  Decline Tdap and Shingrix at this time  Patient Care Team: Johna SheriffVincent, Carol L, MD as PCP - General (Pediatrics)  Indicate any recent Medical Services you may have received from other than [facility] providers in the past year (date may be approximate).     Assessment:   This is a routine wellness examination for Sharon Henry.   Hearing/Vision screen No trouble  hearing.  Declines eye exam.  HusbandKathlene November( Mike) states that patient will not wear glasses or contacts  Dietary issues and exercise activities discussed: Current Exercise Habits: Home exercise routine, Type of exercise: walking, Time (Minutes): > 60, Frequency (Times/Week): 7, Weekly Exercise (Minutes/Week): 0, Intensity: Moderate  Goals    . Have 3 meals a day          Increase lean proteins  Increase fruits and vegetables Increase Water intake      Depression Screen PHQ 2/9 Scores 09/04/2016 09/04/2016 08/29/2016 05/28/2016 12/01/2015 08/30/2015 03/23/2015  PHQ - 2 Score 0 0 0 0 0 1 0  PHQ- 9 Score - - - - - - -   No depression noted at this time Fall Risk Fall Risk  09/04/2016 09/04/2016 08/29/2016 05/28/2016 12/01/2015  Falls in the past year? No No No No No  No fall in the past year.  Cognitive Function: MMSE - Mini Mental State Exam 09/04/2016 12/20/2014  Orientation to time 0 0  Orientation to Place 0 0  Registration 0 3  Attention/ Calculation 0 0  Recall 0 0  Language- name 2 objects 0 2  Language- repeat 0 0  Language- follow 3 step command 0 3  Language- read & follow direction 0 1  Write a sentence 0 0  Copy design 0 0  Total score 0 9    Diagnosed with Alzheimer's.     Screening Tests Health Maintenance  Topic Date Due  . TETANUS/TDAP  01/12/1967  . MAMMOGRAM  10/04/2016 (Originally 06/15/2015)  . Hepatitis C Screening  03/20/2017 (Originally 1945-08-06)  . INFLUENZA VACCINE  10/17/2016  . DEXA SCAN  06/11/202018  . COLONOSCOPY  09/13/2020  . PNA vac Low Risk Adult  Completed   Declines Tdap, Shingrix and eye exam.  Mammogram ordered.  Will check pricing on Tdap and Shingrix at next visit.    Plan:   Encouraged to continue to exercise daily.  Encourage to increase lean proteins, fruits and vegetables to diet.  Encouraged husband to bring a copy of Advance directive to be scanned into chart.  Gave handout to husband for caregiver for alzheimer's patient.  Gave  instructions on how to keep patients hand busy (folding towels, sorting).  Encourage to keep follow up appointment with Dr. Oswaldo DoneVincent.   I have personally reviewed and noted the following in the patient'S chart:   . Medical and social history . Use of alcohol, tobacco or illicit drugs  . Current medications and supplements . Functional ability and status . Nutritional status . Physical activity . Advanced directives . List of other physicians . Hospitalizations, surgeries, and ER visits in previous 12 months . Vitals . Screenings to include cognitive,  depression, and falls . Referrals and appointments  In addition, I have reviewed and discussed with patient certain preventive protocols, quality metrics, and best practice recommendations. A written personalized care plan for preventive services as well as general preventive health recommendations were provided to patient.     Caryl Bis, LPN   1/61/0960    I have reviewed and agree with the above AWV documentation.  Mechele Claude, M.D.

## 2016-09-04 NOTE — Patient Instructions (Addendum)
Keep Follow up Appt with Dr. Oswaldo Done Schedule mammogram Increase lean proteins, fruits and vegetable Check pricing on tdap and shingrix at nex office visit. Sharon Henry , Thank you for taking time to come for your Medicare Wellness Visit. I appreciate your ongoing commitment to your health goals. Please review the following plan we discussed and let me know if I can assist you in the future.   These are the goals we discussed: Goals    . Have 3 meals a day          Increase lean proteins  Increase fruits and vegetables Increase Water intake       This is a list of the screening recommended for you and due dates:  Health Maintenance  Topic Date Due  . Tetanus Vaccine  01/12/1967  . Mammogram  10/04/2016*  .  Hepatitis C: One time screening is recommended by Center for Disease Control  (CDC) for  adults born from [birth year] through 1963.   03/20/2017*  . Flu Shot  10/17/2016  . DEXA scan (bone density measurement)  January 15, 202018  . Colon Cancer Screening  09/13/2020  . Pneumonia vaccines  Completed  *Topic was postponed. The date shown is not the original due date.     Advance Directive Advance directives are legal documents that let you make choices ahead of time about your health care and medical treatment in case you become unable to communicate for yourself. Advance directives are a way for you to communicate your wishes to family, friends, and health care providers. This can help convey your decisions about end-of-life care if you become unable to communicate. Discussing and writing advance directives should happen over time rather than all at once. Advance directives can be changed depending on your situation and what you want, even after you have signed the advance directives. If you do not have an advance directive, some states assign family decision makers to act on your behalf based on how closely you are related to them. Each state has its own laws regarding advance directives. You  may want to check with your health care provider, attorney, or state representative about the laws in your state. There are different types of advance directives, such as:  Medical power of attorney.  Living will.  Do not resuscitate (DNR) or do not attempt resuscitation (DNAR) order.  Health care proxy and medical power of attorney A health care proxy, also called a health care agent, is a person who is appointed to make medical decisions for you in cases in which you are unable to make the decisions yourself. Generally, people choose someone they know well and trust to represent their preferences. Make sure to ask this person for an agreement to act as your proxy. A proxy may have to exercise judgment in the event of a medical decision for which your wishes are not known. A medical power of attorney is a legal document that names your health care proxy. Depending on the laws in your state, after the document is written, it may also need to be:  Signed.  Notarized.  Dated.  Copied.  Witnessed.  Incorporated into your medical record.  You may also want to appoint someone to manage your financial affairs in a situation in which you are unable to do so. This is called a durable power of attorney for finances. It is a separate legal document from the durable power of attorney for health care. You may choose the same person or someone different  from your health care proxy to act as your agent in financial matters. If you do not appoint a proxy, or if there is a concern that the proxy is not acting in your best interests, a court-appointed guardian may be designated to act on your behalf. Living will A living will is a set of instructions documenting your wishes about medical care when you cannot express them yourself. Health care providers should keep a copy of your living will in your medical record. You may want to give a copy to family members or friends. To alert caregivers in case of an  emergency, you can place a card in your wallet to let them know that you have a living will and where they can find it. A living will is used if you become:  Terminally ill.  Incapacitated.  Unable to communicate or make decisions.  Items to consider in your living will include:  The use or non-use of life-sustaining equipment, such as dialysis machines and breathing machines (ventilators).  A DNR or DNAR order, which is the instruction not to use cardiopulmonary resuscitation (CPR) if breathing or heartbeat stops.  The use or non-use of tube feeding.  Withholding of food and fluids.  Comfort (palliative) care when the goal becomes comfort rather than a cure.  Organ and tissue donation.  A living will does not give instructions for distributing your money and property if you should pass away. It is recommended that you seek the advice of a lawyer when writing a will. Decisions about taxes, beneficiaries, and asset distribution will be legally binding. This process can relieve your family and friends of any concerns surrounding disputes or questions that may come up about the distribution of your assets. DNR or DNAR A DNR or DNAR order is a request not to have CPR in the event that your heart stops beating or you stop breathing. If a DNR or DNAR order has not been made and shared, a health care provider will try to help any patient whose heart has stopped or who has stopped breathing. If you plan to have surgery, talk with your health care provider about how your DNR or DNAR order will be followed if problems occur. Summary  Advance directives are the legal documents that allow you to make choices ahead of time about your health care and medical treatment in case you become unable to communicate for yourself.  The process of discussing and writing advance directives should happen over time. You can change the advance directives, even after you have signed them.  Advance directives  include DNR or DNAR orders, living wills, and designating an agent as your medical power of attorney. This information is not intended to replace advice given to you by your health care provider. Make sure you discuss any questions you have with your health care provider. Document Released: 06/12/2007 Document Revised: 01/23/2016 Document Reviewed: 01/23/2016 Elsevier Interactive Patient Education  2017 [employer]  Memory Compensation Strategies  1. Use "WARM" strategy.  W= write it down  A= associate it  R= repeat it  M= make a mental note  2.   You can keep a Glass blower/designerMemory Notebook.  Use a 3-ring notebook with sections for the following: calendar, important names and phone numbers,  medications, doctors' names/phone numbers, lists/reminders, and a section to journal what you did  each day.   3.    Use a calendar to write appointments down.  4.    Write yourself a schedule for the  day.  This can be placed on the calendar or in a separate section of the Memory Notebook.  Keeping a  regular schedule can help memory.  5.    Use medication organizer with sections for each day or morning/evening pills.  You may need help loading it  6.    Keep a basket, or pegboard by the door.  Place items that you need to take out with you in the basket or on the pegboard.  You may also want to  include a message board for reminders.  7.    Use sticky notes.  Place sticky notes with reminders in a place where the task is performed.  For example: " turn off the  stove" placed by the stove, "lock the door" placed on the door at eye level, " take your medications" on  the bathroom mirror or by the place where you normally take your medications.  8.    Use alarms/timers.  Use while cooking to remind yourself to check on food or as a reminder to take your medicine, or as a  reminder to make a call, or as a reminder to perform another task, etc.  Alzheimer Disease Caregiver Guide A person who has Alzheimer  disease may not be able to take care of himself or herself. He or she may need help with simple tasks. The tips below can help you care for the person. Memory loss and confusion If the person is confused or cannot remember things:  Stay calm.  Respond with a short answer.  Avoid correcting him or her in a way that sounds like scolding.  Try not to take it personally, even if he or she forgets your name.  Behavior changes The person may go through behavior changes. This can include depression, anxiety, anger, or seeing things that are not there. When behavior changes:  Try not to take behavior changes personally.  Stay calm and patient.  Do not argue or try to convince the person about a specific point.  Know that these changes are part of the disease process. Try to work through it.  Tips to lessen frustration  Make appointments and do daily tasks when the person is at his or her best.  Take your time. Simple tasks may take longer. Allow plenty of time to complete tasks.  Limit choices for the person.  Involve the person in what you are doing.  Stick to a routine.  Avoid new or crowded places, if possible.  Use simple words, short sentences, and a calm voice. Only give 1 direction at a time.  Buy clothes and shoes that are easy to put on and take off.  Let people help if they offer. Home safety  Keep floors clear. Remove rugs, magazine racks, and floor lamps.  Keep hallways well lit.  Put a handrail and nonslip mat in the bathtub or shower.  Put childproof locks on cabinets that have dangerous items in them. These items include medicine, alcohol, guns, toxic cleaning items, sharp tools, matches, or lighters.  Place locks on doors where the person cannot see or reach them. This helps the person to not wander out of the house and get lost.  Be prepared for emergencies. Keep a list of emergency phone numbers and addresses in a handy area. Plans for the  future  Talk about finances. ? Talk about money management. People with Alzheimer disease have trouble managing their money as the disease gets worse. ? Get help from professional  advisors about financial and legal matters.  Talk about future care. ? Choose a power of attorney. This is someone who can make decisions for the person with Alzheimer disease when he or she can no longer do so. ? Talk about driving and when it is the right time to stop. The person's doctor can help with this. ? If the person lives alone, make sure he or she is safe. Some people need extra help at home. Other people need more care at a nursing home or care center. Support groups Some benefits of joining a support group include:  Learning ways to manage stress.  Sharing experiences with others.  Getting emotional comfort and support.  Learning new caregiving skills as the disease progresses.  Knowing what community resources are available and taking advantage of them.  Get help if:  The person has a fever.  The person has a sudden behavior change that does not get better with calming strategies.  The person is unable to manage his or her living situation.  The person threatens you or anyone else, including himself or herself.  You are no longer able to care for the person. This information is not intended to replace advice given to you by your health care provider. Make sure you discuss any questions you have with your health care provider. Document Released: 05/28/2011 Document Revised: 08/11/2015 Document Reviewed: 04/25/2011 Elsevier Interactive Patient Education  2017 [employer].

## 2016-12-20 ENCOUNTER — Ambulatory Visit (INDEPENDENT_AMBULATORY_CARE_PROVIDER_SITE_OTHER): Payer: Medicare HMO

## 2016-12-20 DIAGNOSIS — Z23 Encounter for immunization: Secondary | ICD-10-CM | POA: Diagnosis not present

## 2017-02-26 ENCOUNTER — Other Ambulatory Visit: Payer: Self-pay | Admitting: Pediatrics

## 2017-02-26 DIAGNOSIS — R413 Other amnesia: Secondary | ICD-10-CM

## 2017-03-20 ENCOUNTER — Encounter: Payer: Self-pay | Admitting: Pediatrics

## 2017-03-20 ENCOUNTER — Ambulatory Visit (INDEPENDENT_AMBULATORY_CARE_PROVIDER_SITE_OTHER): Payer: Medicare HMO | Admitting: Pediatrics

## 2017-03-20 VITALS — BP 99/66 | HR 68 | Temp 97.1°F | Ht 63.0 in | Wt 134.0 lb

## 2017-03-20 DIAGNOSIS — F0281 Dementia in other diseases classified elsewhere with behavioral disturbance: Secondary | ICD-10-CM

## 2017-03-20 DIAGNOSIS — R413 Other amnesia: Secondary | ICD-10-CM | POA: Diagnosis not present

## 2017-03-20 DIAGNOSIS — G309 Alzheimer's disease, unspecified: Secondary | ICD-10-CM

## 2017-03-20 DIAGNOSIS — Z1231 Encounter for screening mammogram for malignant neoplasm of breast: Secondary | ICD-10-CM

## 2017-03-20 DIAGNOSIS — Z1239 Encounter for other screening for malignant neoplasm of breast: Secondary | ICD-10-CM

## 2017-03-20 MED ORDER — MEMANTINE HCL 10 MG PO TABS: 10.0000 mg | ORAL_TABLET | Freq: Two times a day (BID) | ORAL | 2 refills | Status: DC

## 2017-03-20 MED ORDER — DONEPEZIL HCL 5 MG PO TABS: 5.0000 mg | ORAL_TABLET | Freq: Every day | ORAL | 1 refills | Status: DC

## 2017-03-20 NOTE — Progress Notes (Signed)
  Subjective:   Patient ID: Sharon Henry, female    DOB: December 02, 1945, 72 y.o.   MRN: [id number] CC: Follow-up (6 month) Multiple medical problems HPI: Sharon Henry is a 72 y.o. female presenting for Follow-up (6 month)  Patient with significant dementia, her husband is her primary caregiver who is here with her today [facility], husband had to stop taking her about a year and half ago because she was roaming too much  Pt walking regularly, husband has locks on doors to keep her from wandering He has a couple people he can leave her with if he needs a few hours break  She has had a good appetite He has tried to take her for a mammogram the past 2 years, she is not cooperative with the exam  Relevant past medical, surgical, family and social history reviewed. Allergies and medications reviewed and updated. Social History   Tobacco Use  Smoking Status Never Smoker  Smokeless Tobacco Never Used   ROS: Per HPI   Objective:    BP 99/66   Pulse 68   Temp (!) 97.1 F (36.2 C) (Oral)   Ht 5\' 3"  (1.6 m)   Wt 134 lb (60.8 kg)   BMI 23.74 kg/m   Wt Readings from Last 3 Encounters:  03/20/17 134 lb (60.8 kg)  09/04/16 138 lb 12.8 oz (63 kg)  08/29/16 137 lb (62.1 kg)    Gen: NAD, alert, cooperative with exam, NCAT EYES: EOMI, no conjunctival injection, or no icterus ENT:  TMs pearly gray b/l, OP without erythema LYMPH: no cervical LAD CV: NRRR, normal S1/S2, no murmur, distal pulses 2+ b/l Resp: CTABL, no wheezes, normal WOB Abd: +BS, soft, NTND. no guarding or organomegaly Ext: No edema, warm Neuro: Alert and oriented, strength equal b/l UE and LE, coordination grossly normal MSK: normal muscle bulk  Breast: normal exam b/l  Assessment & Plan:  Sharon Henry was seen today for follow-up multiple medical problems  Diagnoses and all orders for this visit:  Alzheimer's dementia with behavioral disturbance, unspecified timing of dementia  onset Offered referral to Alzheimer's support network, patient husband declined for now He does think she is safe at home right now, he is able to supply the supervision that she needs  Memory loss -     donepezil (ARICEPT) 5 MG tablet; Take 1 tablet (5 mg total) by mouth at bedtime.  Memory deficit -     memantine (NAMENDA) 10 MG tablet; Take 1 tablet (10 mg total) by mouth 2 (two) times daily.  Breast cancer screening Patient not able to cooperate with mammogram, exam performed today, was normal  Follow up plan: 6 months, sooner if needed Rex Krasarol Sissi Padia, MD [facility]

## 2017-03-27 ENCOUNTER — Other Ambulatory Visit: Payer: Self-pay | Admitting: *Deleted

## 2017-03-27 DIAGNOSIS — F0391 Unspecified dementia with behavioral disturbance: Secondary | ICD-10-CM

## 2017-03-27 NOTE — Progress Notes (Signed)
Referral placed to Neurology Dr. [facility] for Dementia

## 2017-07-26 DIAGNOSIS — Z0289 Encounter for other administrative examinations: Secondary | ICD-10-CM

## 2017-07-29 ENCOUNTER — Telehealth: Payer: Self-pay | Admitting: Pediatrics

## 2017-07-29 NOTE — Telephone Encounter (Signed)
Call discussed with husband.  He is requesting a handicap placard for safety concerns.  Wrote a Physicist, medical for [employer] and support, discussed she does not qualify based on mobility impairments.

## 2017-09-23 ENCOUNTER — Ambulatory Visit (INDEPENDENT_AMBULATORY_CARE_PROVIDER_SITE_OTHER): Payer: Medicare HMO | Admitting: Pediatrics

## 2017-09-23 ENCOUNTER — Encounter: Payer: Self-pay | Admitting: Pediatrics

## 2017-09-23 VITALS — BP 98/63 | HR 68 | Temp 97.4°F | Ht 63.0 in | Wt 136.0 lb

## 2017-09-23 DIAGNOSIS — R413 Other amnesia: Secondary | ICD-10-CM

## 2017-09-23 DIAGNOSIS — F0281 Dementia in other diseases classified elsewhere with behavioral disturbance: Secondary | ICD-10-CM | POA: Diagnosis not present

## 2017-09-23 DIAGNOSIS — G309 Alzheimer's disease, unspecified: Secondary | ICD-10-CM

## 2017-09-23 NOTE — Progress Notes (Signed)
  Subjective:   Patient ID: Sharon Henry, female    DOB: [birth year], 72 y.o.   MRN: [id number] CC: Medical Management of Chronic Issues  HPI: Sharon Henry is a 72 y.o. female   Here today with her husband for follow-up.  He thinks her dementia has been fairly stable.  He is with her 24 hours a day 7 days a week because she does need supervision.  She is able to dress herself but sometimes needs to be corrected, such as putting underwear over her head or putting undergarments on over her outer clothes.  She is able to feed herself but he prepares all meals.  She usually is able to use the bathroom on her own, if she is in an unfamiliar place she will use inappropriate locations to relieve herself.  She sometimes uses towels instead of toilet paper in the bathroom at home.  He does not think she would cooperate for mammogram and would like to discontinue this screening.  He feels well supported, does not need any additional support right now.  He stopped giving her the Namenda and Aricept several weeks ago.  He has not noticed any difference in her behavior, he does think she is a little bit more awake than she was before.  He would like to stop these medicines at this time.  Relevant past medical, surgical, family and social history reviewed. Allergies and medications reviewed and updated. Social History   Tobacco Use  Smoking Status Never Smoker  Smokeless Tobacco Never Used   ROS: Per HPI   Objective:    BP 98/63   Pulse 68   Temp (!) 97.4 F (36.3 C) (Oral)   Ht 5\' 3"  (1.6 m)   Wt 136 lb (61.7 kg)   BMI 24.09 kg/m   Wt Readings from Last 3 Encounters:  09/23/17 136 lb (61.7 kg)  03/20/17 134 lb (60.8 kg)  09/04/16 138 lb 12.8 oz (63 kg)    Gen: NAD, alert, cooperative with exam, NCAT EYES: EOMI, no conjunctival injection, or no icterus ENT: OP without erythema LYMPH: no cervical LAD CV: NRRR, normal S1/S2, no murmur, distal pulses 2+ b/l Resp: CTABL, no wheezes, normal  WOB Abd: +BS, soft, NTND. no guarding or organomegaly Ext: No edema, warm Neuro: Alert and oriented, strength equal b/l UE and LE, coordination grossly normal MSK: normal muscle bulk  Assessment & Plan:  Sharon Henry was seen today for medical management of chronic issues.  Diagnoses and all orders for this visit:  Alzheimer's dementia with behavioral disturbance, unspecified timing of dementia onset Symptoms stable.  Offered support for her husband.  Okay to discontinue Aricept and Namenda, they have not been very helpful and she seems more week without them.  Memory loss  Memory deficit   Follow up plan: Return in about 6 months (around 03/26/2018). Rex Krasarol Auriella Wieand, MD [facility]

## 2017-11-04 ENCOUNTER — Ambulatory Visit (INDEPENDENT_AMBULATORY_CARE_PROVIDER_SITE_OTHER): Payer: Medicare HMO | Admitting: *Deleted

## 2017-11-04 VITALS — BP 89/59 | HR 61 | Ht 63.0 in | Wt 138.0 lb

## 2017-11-04 DIAGNOSIS — Z0000 Encounter for general adult medical examination without abnormal findings: Secondary | ICD-10-CM

## 2017-11-04 DIAGNOSIS — Z23 Encounter for immunization: Secondary | ICD-10-CM

## 2017-11-04 NOTE — Patient Instructions (Signed)
Please bring a copy of Advanced Directives to be filed in your medical record.   You will need your 2nd Shingles vaccine after 01/04/18.  Thank you for coming in for your Annual Wellness Visit today!!   Preventive Care 70 Years and Older, Female Preventive care refers to lifestyle choices and visits with your health care provider that can promote health and wellness. What does preventive care include?  A yearly physical exam. This is also called an annual well check.  Dental exams once or twice a year.  Routine eye exams. Ask your health care provider how often you should have your eyes checked.  Personal lifestyle choices, including: ? Daily care of your teeth and gums. ? Regular physical activity. ? Eating a healthy diet. ? Avoiding tobacco and drug use. ? Limiting alcohol use. ? Practicing safe sex. ? Taking low-dose aspirin every day. ? Taking vitamin and mineral supplements as recommended by your health care provider. What happens during an annual well check? The services and screenings done by your health care provider during your annual well check will depend on your age, overall health, lifestyle risk factors, and family history of disease. Counseling Your health care provider may ask you questions about your:  Alcohol use.  Tobacco use.  Drug use.  Emotional well-being.  Home and relationship well-being.  Sexual activity.  Eating habits.  History of falls.  Memory and ability to understand (cognition).  Work and work Statistician.  Reproductive health.  Screening You may have the following tests or measurements:  Height, weight, and BMI.  Blood pressure.  Lipid and cholesterol levels. These may be checked every 5 years, or more frequently if you are over 64 years old.  Skin check.  Lung cancer screening. You may have this screening every year starting at age 70 if you have a 30-pack-year history of smoking and currently smoke or have quit within  the past 15 years.  Fecal occult blood test (FOBT) of the stool. You may have this test every year starting at age 70.  Flexible sigmoidoscopy or colonoscopy. You may have a sigmoidoscopy every 5 years or a colonoscopy every 10 years starting at age 70.  Hepatitis C blood test.  Hepatitis B blood test.  Sexually transmitted disease (STD) testing.  Diabetes screening. This is done by checking your blood sugar (glucose) after you have not eaten for a while (fasting). You may have this done every 1-3 years.  Bone density scan. This is done to screen for osteoporosis. You may have this done starting at age 70.  Mammogram. This may be done every 1-2 years. Talk to your health care provider about how often you should have regular mammograms.  Talk with your health care provider about your test results, treatment options, and if necessary, the need for more tests. Vaccines Your health care provider may recommend certain vaccines, such as:  Influenza vaccine. This is recommended every year.  Tetanus, diphtheria, and acellular pertussis (Tdap, Td) vaccine. You may need a Td booster every 10 years.  Varicella vaccine. You may need this if you have not been vaccinated.  Zoster vaccine. You may need this after age 70.  Measles, mumps, and rubella (MMR) vaccine. You may need at least one dose of MMR if you were born in 1957 or later. You may also need a second dose.  Pneumococcal 13-valent conjugate (PCV13) vaccine. One dose is recommended after age 70.  Pneumococcal polysaccharide (PPSV23) vaccine. One dose is recommended after age 70.  Meningococcal vaccine. You may need this if you have certain conditions.  Hepatitis A vaccine. You may need this if you have certain conditions or if you travel or work in places where you may be exposed to hepatitis A.  Hepatitis B vaccine. You may need this if you have certain conditions or if you travel or work in places where you may be exposed to  hepatitis B.  Haemophilus influenzae type b (Hib) vaccine. You may need this if you have certain conditions.  Talk to your health care provider about which screenings and vaccines you need and how often you need them. This information is not intended to replace advice given to you by your health care provider. Make sure you discuss any questions you have with your health care provider. Document Released: 04/01/2015 Document Revised: 11/23/2015 Document Reviewed: 01/04/2015 Elsevier Interactive Patient Education  [employer].

## 2017-11-05 ENCOUNTER — Encounter: Payer: Self-pay | Admitting: *Deleted

## 2017-11-05 NOTE — Progress Notes (Signed)
Subjective:   Sharon Henry is a 72 y.o. female who presents for Medicare Annual (Subsequent) preventive examination.  Sharon Henry is accompanied today by her husband of 38 years, who is her primary caregiver.  She requires constant supervision, and helps with her ADLs.  Sharon Henry has been diagnosed with Alzheimer's, and her husband states she developed symptoms around age 72.  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  She enjoys going on trips with her husband and to church.  She has one son and one step son and 4 grandchildren.  Sharon Henry's husband feels that her health is about the same this year as it was last year.  He reports no hospitalizations, ER visits, or surgeries in the past year.   Review of Systems:   All systems negative today  Cardiac Risk Factors include: advanced age (>7055men, 35>65 women)     Objective:     Vitals: BP (!) 89/59   Pulse 61   Ht 5\' 3"  (1.6 m)   Wt 138 lb (62.6 kg)   BMI 24.45 kg/m   Body mass index is 24.45 kg/m.  Advanced Directives 11/04/2017 09/04/2016 12/20/2014  Does Patient Have a Medical Advance Directive? Yes Yes Yes  Type of Estate agentAdvance Directive Healthcare Power of [address] Corporationttorney Healthcare Power of [place];Living will Healthcare Power of [place]orney;Living will  Copy of Healthcare Power of Attorney in Chart? No - copy requested No - copy requested Yes    Tobacco Social History   Tobacco Use  Smoking Status Never Smoker  Smokeless Tobacco Never Used     Counseling given: No   Clinical Intake:                       Past Medical History:  Diagnosis Date  . Allergic rhinitis   . Diverticulosis of sigmoid colon 2005    small   . Glaucoma   . HLD (hyperlipidemia)   . Memory deficit 01/07/2013  . Sinusitis    Past Surgical History:  Procedure Laterality Date  . COLONOSCOPY  2005;09/14/10   diverticulosis; diverticulosis and hemorrhoids  . NEPHRECTOMY     Left    Family History    Problem Relation Age of Onset  . Heart attack Father 7152  . COPD Mother   . Heart attack Sister 8750  . Diabetes Brother   . Ovarian cancer Maternal Aunt   . Colon cancer Neg Hx    Social History   Socioeconomic History  . Marital status: Married    Spouse name: Sharon Henry  . Number of children: 1  . Years of education: GED  . Highest education level: Not on file  Occupational History  . Occupation: Event organiserManager    Employer: [employer]    Comment: Retired  . Occupation: Retired   Engineer, productionocial Needs  . Financial resource strain: Not on file  . Food insecurity:    Worry: Not on file    Inability: Not on file  . Transportation needs:    Medical: Not on file    Non-medical: Not on file  Tobacco Use  . Smoking status: Never Smoker  . Smokeless tobacco: Never Used  Substance and Sexual Activity  . Alcohol use: No    Comment: One beer a week  . Drug use: No  . Sexual activity: Not on file  Lifestyle  . Physical activity:    Days per week: Not on file    Minutes per  session: Not on file  . Stress: Not on file  Relationships  . Social connections:    Talks on phone: Not on file    Gets together: Not on file    Attends religious service: Not on file    Active member of club or organization: Not on file    Attends meetings of clubs or organizations: Not on file    Relationship status: Not on file  Other Topics Concern  . Not on file  Social History Narrative   One cup of coffee in morning .   Retired.   Lives at home with her husband Systems analyst(Sharon Henry).   Education - GED   Right handed.    Outpatient Encounter Medications as of 11/04/2017  Medication Sig  . aspirin 81 MG tablet Take 81 mg by mouth daily. Takes on Mondays and Wednesdays  . cholecalciferol (VITAMIN D) 1000 UNITS tablet Take 1,000 Units by mouth daily.  . Coenzyme Q10 (COQ10 PO) Take by mouth daily.    Marland Kitchen. donepezil (ARICEPT) 5 MG tablet Take 5 mg by mouth at bedtime.  . folic acid (FOLVITE) 400 MCG tablet Take 400 mcg by  mouth daily.  . memantine (NAMENDA) 10 MG tablet Take 10 mg by mouth 2 (two) times daily.  . Multiple Vitamin (MULTIVITAMIN PO) Take by mouth daily.    . vitamin B-12 (CYANOCOBALAMIN) 1000 MCG tablet Take 1,000 mcg by mouth daily.     No facility-administered encounter medications on file as of 11/04/2017.     Activities of Daily Living In your present state of health, do you have any difficulty performing the following activities: 11/04/2017  Hearing? N  Vision? N  Difficulty concentrating or making decisions? Y  Walking or climbing stairs? N  Dressing or bathing? Y  Comment Husband helps with bathing and dressing  Doing errands, shopping? Y  Comment Husband takes her to appointments and does grocery shopping  Preparing Food and eating ? Y  Comment Husband prepares food, patient eats on her own   Using the Toilet? N  In the past six months, have you accidently leaked urine? N  Do you have problems with loss of bowel control? N  Managing your Medications? Y  Comment Husband manages medications   Managing your Finances? Y  Comment Husband manages finances   Housekeeping or managing your Housekeeping? Y  Comment Husband does house work   Some recent data might be hidden    Patient Care Team: Johna SheriffVincent, Carol L, MD as PCP - General (Pediatrics)    Assessment:   This is a routine wellness examination for Sharon Henry.  Exercise Activities and Dietary recommendations  Patient's husband prepares all of her meals, or they eat out.  He states she eats 2 meals per day and 1 snack.  They avoid fried foods, and eat fruits and vegetables daily.  Recommended a diet of mostly lean proteins, vegetables, fruits, and whole grains.   Current Exercise Habits: The patient does not participate in regular exercise at present, Exercise limited by: neurologic condition(s)  Patient's husband states she used to walk 4-5 miles per day with him, but in the past few months he has been unable to get her to go  walking.    Goals    . Have 3 meals a day     Increase lean proteins  Increase fruits and vegetables Increase Water intake       Fall Risk Fall Risk  11/04/2017 09/23/2017 03/20/2017 09/04/2016 09/04/2016  Falls in the past  year? No No No No No   Is the patient's home free of loose throw rugs in walkways, pet beds, electrical cords, etc?   yes      Grab bars in the bathroom? yes      Handrails on the stairs?   yes      Adequate lighting?   yes   Depression Screen PHQ 2/9 Scores 11/04/2017 09/23/2017 03/20/2017 09/04/2016  PHQ - 2 Score 0 0 0 0  PHQ- 9 Score - - - -     Cognitive Function MMSE - Mini Mental State Exam 11/05/2017 09/04/2016 12/20/2014  Not completed: Unable to complete - -  Orientation to time - 0 0  Orientation to Place - 0 0  Registration - 0 3  Attention/ Calculation - 0 0  Recall - 0 0  Language- name 2 objects - 0 2  Language- repeat - 0 0  Language- follow 3 step command - 0 3  Language- read & follow direction - 0 1  Write a sentence - 0 0  Copy design - 0 0  Total score - 0 9        Immunization History  Administered Date(s) Administered  . Influenza, High Dose Seasonal PF 12/14/2015, 12/20/2016  . Influenza,inj,Quad PF,6+ Mos 12/25/2012, 12/21/2013, 12/15/2014  . PPD Test 12/02/2015  . Pneumococcal Conjugate-13 12/30/2013  . Pneumococcal Polysaccharide-23 Jul 21, 202016  . Tdap 11/04/2017  . Zoster Recombinat (Shingrix) 11/04/2017    Qualifies for Shingles Vaccine? Given today  Screening Tests Health Maintenance  Topic Date Due  . Hepatitis C Screening  1945-09-15  . DEXA SCAN  Jul 21, 202018  . INFLUENZA VACCINE  10/17/2017  . COLONOSCOPY  09/13/2020  . TETANUS/TDAP  11/05/2027  . PNA vac Low Risk Adult  Completed  . MAMMOGRAM  Discontinued   Dexa scan declined today Recommend Hepatitis C screening at next visit with Dr. Oswaldo Done.  Cancer Screenings: Lung: Low Dose CT Chest recommended if Age 67-80 years, 30 pack-year currently smoking OR have  quit w/in 15years. Patient does not qualify. Breast:  Up to date on Mammogram? No - discontinued as husband does not feel like she would cooperate with having test done. Up to date of Bone Density/Dexa? No- declined today  Colorectal: up to date  Additional Screenings:  Hepatitis C Screening:  Recommend at next visit with Dr. Oswaldo Done     Plan:     Bring a copy of Advanced Directives to be filed in your medical record.  Second Shingles vaccine needed after 01/04/18.   I have personally reviewed and noted the following in the patient's chart:   . Medical and social history . Use of alcohol, tobacco or illicit drugs  . Current medications and supplements . Functional ability and status . Nutritional status . Physical activity . Advanced directives . List of other physicians . Hospitalizations, surgeries, and ER visits in previous 12 months . Vitals . Screenings to include cognitive, depression, and falls . Referrals and appointments  In addition, I have reviewed and discussed with patient certain preventive protocols, quality metrics, and best practice recommendations. A written personalized care plan for preventive services as well as general preventive health recommendations were provided to patient.     Bernadene Bell, RN  11/05/2017

## 2017-12-24 ENCOUNTER — Ambulatory Visit (INDEPENDENT_AMBULATORY_CARE_PROVIDER_SITE_OTHER): Payer: Medicare HMO | Admitting: *Deleted

## 2017-12-24 DIAGNOSIS — Z23 Encounter for immunization: Secondary | ICD-10-CM | POA: Diagnosis not present

## 2017-12-31 ENCOUNTER — Encounter: Payer: Self-pay | Admitting: *Deleted

## 2018-01-08 ENCOUNTER — Ambulatory Visit: Payer: Medicare HMO | Admitting: *Deleted

## 2018-01-08 DIAGNOSIS — Z23 Encounter for immunization: Secondary | ICD-10-CM

## 2018-01-23 ENCOUNTER — Telehealth: Payer: Self-pay | Admitting: *Deleted

## 2018-01-23 DIAGNOSIS — R451 Restlessness and agitation: Secondary | ICD-10-CM

## 2018-01-23 MED ORDER — RISPERIDONE 0.25 MG PO TABS: 0.2500 mg | ORAL_TABLET | Freq: Two times a day (BID) | ORAL | 0 refills | Status: DC | PRN

## 2018-01-23 NOTE — Telephone Encounter (Signed)
Patient's husband came by office with patient stating that patient is very anxious, "can't sit still", not sleeping at night.  Was given patient benadryl but is not helping patient at this time.  Appt made to see Dr. Oswaldo Done on Monday 01/27/2018 at 11:30 but husband would like to know what else he could do until appt.

## 2018-01-23 NOTE — Telephone Encounter (Signed)
Husband aware.

## 2018-01-23 NOTE — Addendum Note (Signed)
Addended by: Johna Sheriff on: 01/23/2018 01:00 PM   Modules accepted: Orders

## 2018-01-23 NOTE — Telephone Encounter (Signed)
Keep appt with me on Monday. I sent in risperidone 0.25mg , OK to take twice a day for agitation to try over next few days until I see you in clinic.

## 2018-01-27 ENCOUNTER — Telehealth: Payer: Self-pay | Admitting: Pediatrics

## 2018-01-27 ENCOUNTER — Encounter: Payer: Self-pay | Admitting: Pediatrics

## 2018-01-27 ENCOUNTER — Ambulatory Visit (INDEPENDENT_AMBULATORY_CARE_PROVIDER_SITE_OTHER): Payer: Medicare HMO | Admitting: Pediatrics

## 2018-01-27 VITALS — Temp 97.0°F | Ht 63.0 in | Wt 139.0 lb

## 2018-01-27 DIAGNOSIS — R451 Restlessness and agitation: Secondary | ICD-10-CM | POA: Diagnosis not present

## 2018-01-27 DIAGNOSIS — F0281 Dementia in other diseases classified elsewhere with behavioral disturbance: Secondary | ICD-10-CM

## 2018-01-27 DIAGNOSIS — G309 Alzheimer's disease, unspecified: Secondary | ICD-10-CM | POA: Diagnosis not present

## 2018-01-27 NOTE — Telephone Encounter (Signed)
Patient now here for appt

## 2018-01-27 NOTE — Progress Notes (Signed)
  Subjective:   Patient ID: Sharon Henry, female    DOB: 1946/01/25, 72 y.o.   MRN: [id number] CC: Anxiety and Fatigue  HPI: Sharon Henry is a 72 y.o. female   Here today with husband.  History of Alzheimer's dementia.  History is provided by husband.  Starting a few weeks ago, she has been more agitated than usual.  Constantly messing with stuff at home.  Will take the dishes out of cabinets and put them in the sink.  Will move things from one room to another.  Starting 2 days ago, has been using risperidone in the morning and at night.  He thinks melatonin at night has been enough to help with her sleeping.  Before that she was often up all night long.  He does think the risperidone during the day has been helping some with agitation.  She is not walking as much is before, started walking behind her husband instead of next to him.  She will wander off and he does not know it when they go for walks.  She does need 24-hour supervision.   He has 2 other family members who sometimes help watch her when he needs.  He does feel like he is able to continue to care for her now at home.  He would like to keep her at home as long as he can.    Relevant past medical, surgical, family and social history reviewed. Allergies and medications reviewed and updated. Social History   Tobacco Use  Smoking Status Never Smoker  Smokeless Tobacco Never Used   ROS: Per HPI   Objective:    Temp (!) 97 F (36.1 C) (Axillary)   Ht 5\' 3"  (1.6 m)   Wt 139 lb (63 kg)   BMI 24.62 kg/m   Wt Readings from Last 3 Encounters:  01/27/18 139 lb (63 kg)  11/04/17 138 lb (62.6 kg)  09/23/17 136 lb (61.7 kg)    Gen: NAD, alert, NCAT EYES: EOMI, no conjunctival injection, or no icterus ENT:   OP without erythema LYMPH: no cervical LAD CV: NRRR, normal S1/S2, no murmur, distal pulses 2+ b/l Resp: CTABL, no wheezes, normal WOB Abd: +BS, soft, NTND.  Ext: No edema, warm Neuro: Alert, nods head along with  conversation, does not answer questions or contribute. MSK: normal muscle bulk  Patient not cooperative with blood pressure reading today.  Assessment & Plan:  Sharon Henry was seen today for anxiety and fatigue.  Diagnoses and all orders for this visit:  Alzheimer's dementia with behavioral disturbance, unspecified timing of dementia onset (HCC) Taking memantine and donepezil.  Husband not sure that it is helping.  He does not wish to discontinue now.  Agitation Has been providing 24-hour care, reorienting and redirecting as able.  Okay to use risperidone 0.25mg  1-2 times a day as needed for agitation.  Any worsening side effects, let me know.  Discussed next steps if behavior remains a concern, consider referral to [facility] for symptom management and palliative care.  Follow up plan: Return in about 3 months (around 04/29/2018). Rex Kras, MD [place] [facility]

## 2018-01-27 NOTE — Telephone Encounter (Signed)
Spouse has called and  he said that he is not going to be able to bring her in said that the medicine has knocked her out and is wanting to talk to you bc he thinks the medicine is to much. Sharon Henry was in room will call pt spouse back

## 2018-03-24 ENCOUNTER — Ambulatory Visit: Payer: Medicare HMO | Admitting: Pediatrics

## 2018-03-26 ENCOUNTER — Encounter: Payer: Self-pay | Admitting: Pediatrics

## 2018-03-26 ENCOUNTER — Ambulatory Visit (INDEPENDENT_AMBULATORY_CARE_PROVIDER_SITE_OTHER): Payer: Medicare HMO | Admitting: Pediatrics

## 2018-03-26 VITALS — Ht 63.0 in | Wt 129.0 lb

## 2018-03-26 DIAGNOSIS — G309 Alzheimer's disease, unspecified: Secondary | ICD-10-CM | POA: Diagnosis not present

## 2018-03-26 DIAGNOSIS — R451 Restlessness and agitation: Secondary | ICD-10-CM | POA: Diagnosis not present

## 2018-03-26 DIAGNOSIS — F0281 Dementia in other diseases classified elsewhere with behavioral disturbance: Secondary | ICD-10-CM

## 2018-03-26 MED ORDER — RISPERIDONE 0.25 MG PO TABS: 0.2500 mg | ORAL_TABLET | Freq: Two times a day (BID) | ORAL | 0 refills | Status: DC | PRN

## 2018-03-26 NOTE — Patient Instructions (Signed)
[facility] office: [phone] Provides palliative care, hospice and family support

## 2018-03-26 NOTE — Progress Notes (Signed)
  Subjective:   Patient ID: Sharon Henry, female    DOB: May 20, 1945, 73 y.o.   MRN: [id number] CC: Medical Management of Chronic Issues  HPI: Sharon Henry is a 73 y.o. female   Here today with her husband who is her primary caregiver.  Patient with dementia, wonders at times.  Needs 24-hour supervision.  Has been says things at home have been doing okay.  He does have a few friends or family members will help if he needs.  Appetite is been fine.  Behavior about the same.  Sometimes does make messes, putting things really should not be, toileting in inappropriate places if in unfamiliar setting.   He is not sure if the risperidone has helped much or not.  He is done it just a few times.  She usually is not very agitated.  Last few weeks behavior is been better than a couple months ago when risperidone was restarted as needed.  Relevant past medical, surgical, family and social history reviewed. Allergies and medications reviewed and updated. Social History   Tobacco Use  Smoking Status Never Smoker  Smokeless Tobacco Never Used   ROS: Per HPI   Objective:    Ht 5\' 3"  (1.6 m)   Wt 129 lb (58.5 kg)   BMI 22.85 kg/m   Wt Readings from Last 3 Encounters:  03/26/18 129 lb (58.5 kg)  01/27/18 139 lb (63 kg)  11/04/17 138 lb (62.6 kg)    Gen: NAD, alert, cooperative with exam, NCAT EYES: EOMI, no conjunctival injection, or no icterus ENT: OP without erythema CV: NRRR, normal S1/S2, no murmur, distal pulses 2+ b/l Resp: CTABL, no wheezes, normal WOB Abd: +BS, soft, NTND.  Ext: No edema, warm Neuro: Alert, follows directions sometimes, sometimes has to have tactile stimulus to follow directions, such as sit on table from her husband.  Assessment & Plan:  [name] was seen today for medical management of chronic issues.  Diagnoses and all orders for this visit:  Alzheimer's dementia with behavioral disturbance, unspecified timing of dementia onset (HCC) Stable, discussed  area resources, general supportive care for palliative care options if agitation gets worse, Alzheimer's support network for possible respite care assistance.  Husband says he will let me know if he needs further information.  Agitation Continue below as needed. -     risperiDONE (RISPERDAL) 0.25 MG tablet; Take 1 tablet (0.25 mg total) by mouth 2 (two) times daily as needed. For agitation   Follow up plan: Return in about 6 months (around 09/24/2018). Rex Kras, MD [facility]

## 2018-04-21 ENCOUNTER — Other Ambulatory Visit: Payer: Self-pay | Admitting: Pediatrics

## 2018-04-22 ENCOUNTER — Other Ambulatory Visit: Payer: Self-pay | Admitting: Pediatrics

## 2018-04-22 DIAGNOSIS — R451 Restlessness and agitation: Secondary | ICD-10-CM

## 2018-04-22 MED ORDER — DONEPEZIL HCL 5 MG PO TABS: 5.0000 mg | ORAL_TABLET | Freq: Every day | ORAL | 0 refills | Status: DC

## 2018-04-22 MED ORDER — RISPERIDONE 0.25 MG PO TABS: 0.2500 mg | ORAL_TABLET | Freq: Two times a day (BID) | ORAL | 0 refills | Status: DC | PRN

## 2018-04-22 MED ORDER — MEMANTINE HCL 10 MG PO TABS: 10.0000 mg | ORAL_TABLET | Freq: Two times a day (BID) | ORAL | 2 refills | Status: DC

## 2018-04-22 NOTE — Telephone Encounter (Signed)
What is the name of the medication? memantine (Sharon Henry) 10 MG tablet risperiDONE (RISPERDAL) 0.25 MG tablet donepezil (ARICEPT) 5 MG tablet   Have you contacted your pharmacy to request a refill? yes  Which pharmacy would you like this sent to: [facility] in [place]   Patient notified that their request is being sent to the clinical staff for review and that they should receive a call once it is complete. If they do not receive a call within 24 hours they can check with their pharmacy or our office.

## 2018-05-01 DIAGNOSIS — M79672 Pain in left foot: Secondary | ICD-10-CM | POA: Diagnosis not present

## 2018-05-01 DIAGNOSIS — M19072 Primary osteoarthritis, left ankle and foot: Secondary | ICD-10-CM | POA: Diagnosis not present

## 2018-05-27 ENCOUNTER — Encounter: Payer: Self-pay | Admitting: Family Medicine

## 2018-05-27 ENCOUNTER — Ambulatory Visit (INDEPENDENT_AMBULATORY_CARE_PROVIDER_SITE_OTHER): Payer: Medicare HMO | Admitting: Family Medicine

## 2018-05-27 VITALS — BP 132/74 | HR 86 | Temp 97.4°F

## 2018-05-27 DIAGNOSIS — Z111 Encounter for screening for respiratory tuberculosis: Secondary | ICD-10-CM | POA: Diagnosis not present

## 2018-05-27 DIAGNOSIS — R413 Other amnesia: Secondary | ICD-10-CM | POA: Diagnosis not present

## 2018-05-27 DIAGNOSIS — F0281 Dementia in other diseases classified elsewhere with behavioral disturbance: Secondary | ICD-10-CM

## 2018-05-27 DIAGNOSIS — G309 Alzheimer's disease, unspecified: Secondary | ICD-10-CM

## 2018-05-27 NOTE — Progress Notes (Signed)
Subjective:  Patient ID: Sharon Henry, female    DOB: Oct 20, 1945, 73 y.o.   MRN: [id number]  Chief Complaint:  Placement in nursing home   HPI: Sharon Henry is a 73 y.o. female presenting on 05/27/2018 for Placement in nursing home  Pt and spouse present toady for evaluation and paperwork completion for placement in [facility]. Pt has Alzheimer's dementia with behavioral disturbances and agitation. Spouse states she has had increasing agitation and confusion. States he would like to get her placed in [facility]. Spouse has paperwork with him today. Pt is confused and unable to answer questions. Spouse states pt has a normal diet and has a good appetite. Denies other complaints.   Relevant past medical, surgical, family, and social history reviewed and updated as indicated.  Allergies and medications reviewed and updated.   Past Medical History:  Diagnosis Date  . Allergic rhinitis   . Diverticulosis of sigmoid colon 2005    small   . Glaucoma   . HLD (hyperlipidemia)   . Memory deficit 01/07/2013  . Sinusitis     Past Surgical History:  Procedure Laterality Date  . COLONOSCOPY  2005;09/14/10   diverticulosis; diverticulosis and hemorrhoids  . NEPHRECTOMY     Left     Social History   Socioeconomic History  . Marital status: Married    Spouse name: Micheal  . Number of children: 1  . Years of education: GED  . Highest education level: Not on file  Occupational History  . 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 Occupation: Retired   Scientific laboratory technician  . Financial resource strain: Not on file  . Food insecurity:    Worry: Not on file    Inability: Not on file  . Transportation needs:    Medical: Not on file    Non-medical: Not on file  Tobacco Use  . Smoking status: Never Smoker  . Smokeless tobacco: Never Used  Substance and Sexual Activity  . Alcohol use: No    Comment: One beer a week  . Drug use: No  . Sexual activity:  Not on file  Lifestyle  . Physical activity:    Days per week: Not on file    Minutes per session: Not on file  . Stress: Not on file  Relationships  . Social connections:    Talks on phone: Not on file    Gets together: Not on file    Attends religious service: Not on file    Active member of club or organization: Not on file    Attends meetings of clubs or organizations: Not on file    Relationship status: Not on file  . Intimate partner violence:    Fear of current or ex partner: Not on file    Emotionally abused: Not on file    Physically abused: Not on file    Forced sexual activity: Not on file  Other Topics Concern  . Not on file  Social History Narrative   One cup of coffee in morning .   Retired.   Lives at home with her husband Careers adviser).   Education - GED   Right handed.    Outpatient Encounter Medications as of 05/27/2018  Medication Sig  . aspirin 81 MG tablet Take 81 mg by mouth daily. Takes on Mondays and Wednesdays  . cholecalciferol (VITAMIN D) 1000 UNITS tablet Take 1,000 Units by mouth daily.  . Coenzyme Q10 (  COQ10 PO) Take by mouth daily.    Marland Kitchen donepezil (ARICEPT) 5 MG tablet Take 1 tablet (5 mg total) by mouth at bedtime.  . Multiple Vitamin (MULTIVITAMIN PO) Take by mouth daily.    . vitamin B-12 (CYANOCOBALAMIN) 1000 MCG tablet Take 1,000 mcg by mouth daily.    . folic acid (FOLVITE) 321 MCG tablet Take 400 mcg by mouth daily.  . memantine (NAMENDA) 10 MG tablet Take 1 tablet (10 mg total) by mouth 2 (two) times daily for 30 days.  . risperiDONE (RISPERDAL) 0.25 MG tablet Take 1 tablet (0.25 mg total) by mouth 2 (two) times daily as needed. For agitation (Patient not taking: Reported on 05/27/2018)   No facility-administered encounter medications on file as of 05/27/2018.     Allergies  Allergen Reactions  . Penicillins     Pt states not allergic, never had any reactions to PCN    Review of Systems  Unable to perform ROS: Dementia (ROS per spouse)   Constitutional: Negative for chills, fatigue and fever.  Respiratory: Negative for cough, shortness of breath and wheezing.   Gastrointestinal: Negative for constipation, diarrhea and vomiting.  Neurological: Negative for weakness.  Psychiatric/Behavioral: Positive for agitation, behavioral problems and confusion.  All other systems reviewed and are negative.       Objective:  BP 132/74   Pulse 86   Temp (!) 97.4 F (36.3 C) (Oral)    Wt Readings from Last 3 Encounters:  03/26/18 129 lb (58.5 kg)  01/27/18 139 lb (63 kg)  11/04/17 138 lb (62.6 kg)    Physical Exam Vitals signs and nursing note reviewed.  Constitutional:      General: She is not in acute distress.    Appearance: She is not ill-appearing or toxic-appearing.  HENT:     Head: Normocephalic and atraumatic.     Mouth/Throat:     Mouth: Mucous membranes are moist.     Pharynx: Oropharynx is clear.  Eyes:     Conjunctiva/sclera: Conjunctivae normal.     Pupils: Pupils are equal, round, and reactive to light.  Neck:     Musculoskeletal: Normal range of motion and neck supple.  Cardiovascular:     Rate and Rhythm: Normal rate and regular rhythm.     Heart sounds: Normal heart sounds. No murmur. No friction rub. No gallop.   Pulmonary:     Effort: Pulmonary effort is normal. No respiratory distress.     Breath sounds: Normal breath sounds.  Skin:    General: Skin is warm and dry.     Capillary Refill: Capillary refill takes less than 2 seconds.  Neurological:     Mental Status: She is alert. Mental status is at baseline. She is confused.     Cranial Nerves: Cranial nerves are intact.     Gait: Gait is intact.  Psychiatric:        Behavior: Behavior is uncooperative.        Cognition and Memory: Cognition is impaired. Memory is impaired.     Results for orders placed or performed in visit on 08/29/16  Vitamin B12  Result Value Ref Range   Vitamin B-12 1,206 232 - 1,245 pg/mL  Lipid Panel  Result  Value Ref Range   Cholesterol, Total 169 100 - 199 mg/dL   Triglycerides 80 0 - 149 mg/dL   HDL 63 >39 mg/dL   VLDL Cholesterol Cal 16 5 - 40 mg/dL   LDL Calculated 90 0 - 99 mg/dL  Chol/HDL Ratio 2.7 0.0 - 4.4 ratio  CMP14+EGFR  Result Value Ref Range   Glucose 102 (H) 65 - 99 mg/dL   BUN 20 8 - 27 mg/dL   Creatinine, Ser 0.97 0.57 - 1.00 mg/dL   GFR calc non Af Amer 60 >59 mL/min/1.73   GFR calc Af Amer 69 >59 mL/min/1.73   BUN/Creatinine Ratio 21 12 - 28   Sodium 141 134 - 144 mmol/L   Potassium 4.1 3.5 - 5.2 mmol/L   Chloride 102 96 - 106 mmol/L   CO2 25 20 - 29 mmol/L   Calcium 9.8 8.7 - 10.3 mg/dL   Total Protein 6.4 6.0 - 8.5 g/dL   Albumin 4.3 3.6 - 4.8 g/dL   Globulin, Total 2.1 1.5 - 4.5 g/dL   Albumin/Globulin Ratio 2.0 1.2 - 2.2   Bilirubin Total 0.3 0.0 - 1.2 mg/dL   Alkaline Phosphatase 88 39 - 117 IU/L   AST 18 0 - 40 IU/L   ALT 14 0 - 32 IU/L       Pertinent labs & imaging results that were available during my care of the patient were reviewed by me and considered in my medical decision making.  Assessment & Plan:  Shiniqua was seen today for placement in nursing home.  Diagnoses and all orders for this visit:  Alzheimer's dementia with behavioral disturbance, unspecified timing of dementia onset ([place]) Progressing disease. Spouse is seeking placement in an assisted living facility. Paperwork completed today. Will complete TB screening.   Memory deficit  Screening-pulmonary TB -     PPD   Continue all other maintenance medications.  Follow up plan: Return in about 3 months (around 08/27/2018), or if symptoms worsen or fail to improve.  Return on Thursday for PPD reading, appointment made with triage nurse for 3:00 pm  The above assessment and management plan was discussed with the patient. The patient verbalized understanding of and has agreed to the management plan. Patient is aware to call the clinic if symptoms persist or worsen. Patient is aware  when to return to the clinic for a follow-up visit. Patient educated on when it is appropriate to go to the emergency department.   Monia Pouch, FNP-C [facility] [phone]

## 2018-05-29 ENCOUNTER — Ambulatory Visit: Payer: Medicare HMO | Admitting: *Deleted

## 2018-05-29 ENCOUNTER — Other Ambulatory Visit: Payer: Self-pay

## 2018-05-29 DIAGNOSIS — Z111 Encounter for screening for respiratory tuberculosis: Secondary | ICD-10-CM

## 2018-05-29 LAB — TB SKIN TEST
INDURATION: 0 mm
TB Skin Test: NEGATIVE

## 2018-05-29 NOTE — Progress Notes (Signed)
Ppd test negative Results given to husband FL2 forms given to husband

## 2018-06-04 ENCOUNTER — Other Ambulatory Visit: Payer: Self-pay | Admitting: Family Medicine

## 2018-06-04 ENCOUNTER — Telehealth: Payer: Self-pay | Admitting: *Deleted

## 2018-06-04 DIAGNOSIS — G309 Alzheimer's disease, unspecified: Principal | ICD-10-CM

## 2018-06-04 DIAGNOSIS — F0281 Dementia in other diseases classified elsewhere with behavioral disturbance: Secondary | ICD-10-CM

## 2018-06-04 MED ORDER — RISPERIDONE 1 MG PO TABS: 1.0000 mg | ORAL_TABLET | Freq: Three times a day (TID) | ORAL | 1 refills | Status: DC

## 2018-06-04 NOTE — Telephone Encounter (Signed)
Sent in a new RX for Risperdal 1 mg three times daily. Please tell him to dose her with this three times daily for max effectiveness. Try this for at least one week, if symptoms are progressing, she needs to be seen for possible medication changes.

## 2018-06-04 NOTE — Telephone Encounter (Signed)
Pt's husband is having troubles with wife not sitting still and on the go all the time. Can you give her something to help with this?

## 2018-06-04 NOTE — Telephone Encounter (Signed)
Aware of new medication. 

## 2018-06-05 ENCOUNTER — Other Ambulatory Visit: Payer: Self-pay | Admitting: *Deleted

## 2018-06-05 DIAGNOSIS — R451 Restlessness and agitation: Secondary | ICD-10-CM

## 2018-06-05 MED ORDER — DONEPEZIL HCL 5 MG PO TABS: 5.0000 mg | ORAL_TABLET | Freq: Every day | ORAL | 0 refills | Status: DC

## 2018-06-05 MED ORDER — MEMANTINE HCL 10 MG PO TABS: 10.0000 mg | ORAL_TABLET | Freq: Two times a day (BID) | ORAL | 0 refills | Status: DC

## 2018-06-09 ENCOUNTER — Telehealth: Payer: Self-pay

## 2018-06-09 NOTE — Telephone Encounter (Signed)
Erroneous encounter

## 2018-06-12 ENCOUNTER — Encounter: Payer: Self-pay | Admitting: Family Medicine

## 2018-06-12 ENCOUNTER — Other Ambulatory Visit: Payer: Self-pay

## 2018-06-12 ENCOUNTER — Ambulatory Visit (INDEPENDENT_AMBULATORY_CARE_PROVIDER_SITE_OTHER): Payer: Medicare HMO | Admitting: Family Medicine

## 2018-06-12 DIAGNOSIS — M79672 Pain in left foot: Secondary | ICD-10-CM

## 2018-06-12 NOTE — Progress Notes (Signed)
Virtual Visit via telephone Note  I connected with Sharon Henry' husband (pt has dementia) on 06/12/18 at 1355 by telephone and verified that I am speaking with the correct person using two identifiers. Sharon Henry is currently located at home and Sharon Henry (pt) is currently with them during visit. The provider, Kari Baars, FNP is located in their office at time of visit.  I discussed the limitations, risks, security and privacy concerns of performing an evaluation and management service by telephone and the availability of in person appointments. I also discussed with the patient that there may be a patient responsible charge related to this service. The patient expressed understanding and agreed to proceed.  Subjective:  Patient ID: Sharon Henry, female    DOB: 1946/03/05, 73 y.o.   MRN: [id number]  Chief Complaint:  Left foot pain  HPI: Sharon Henry is a 73 y.o. female presenting on 06/12/2018 for left foot pain  Pt has dementia, husband provided information for visit. Pt fell on 04/29/2018 injuring her left foot. Pt was seen at [facility] on 05/01/2018 and had negative xrays. Pt continues to have slight swelling of her left ankle. Husband states she does favor her foot when walking. He states she will not let him ice it or wrap it with an ace wrap. States she is not taking anything for the pain or swelling. No new injuries reported.    Relevant past medical, surgical, family, and social history reviewed and updated as indicated.  Allergies and medications reviewed and updated.   Past Medical History:  Diagnosis Date  . Allergic rhinitis   . Diverticulosis of sigmoid colon 2005    small   . Glaucoma   . HLD (hyperlipidemia)   . Memory deficit 01/07/2013  . Sinusitis     Past Surgical History:  Procedure Laterality Date  . COLONOSCOPY  2005;09/14/10   diverticulosis; diverticulosis and hemorrhoids  . NEPHRECTOMY     Left     Social History    Socioeconomic History  . Marital status: Married    Spouse name: Micheal  . Number of children: 1  . Years of education: GED  . Highest education level: Not on file  Occupational History  . Occupation: Event organiser: [employer]    Comment: Retired  . Occupation: Retired   Engineer, production  . Financial resource strain: Not on file  . Food insecurity:    Worry: Not on file    Inability: Not on file  . Transportation needs:    Medical: Not on file    Non-medical: Not on file  Tobacco Use  . Smoking status: Never Smoker  . Smokeless tobacco: Never Used  Substance and Sexual Activity  . Alcohol use: No    Comment: One beer a week  . Drug use: No  . Sexual activity: Not on file  Lifestyle  . Physical activity:    Days per week: Not on file    Minutes per session: Not on file  . Stress: Not on file  Relationships  . Social connections:    Talks on phone: Not on file    Gets together: Not on file    Attends religious service: Not on file    Active member of club or organization: Not on file    Attends meetings of clubs or organizations: Not on file    Relationship status: Not on file  . Intimate partner violence:    Fear of current or  ex partner: Not on file    Emotionally abused: Not on file    Physically abused: Not on file    Forced sexual activity: Not on file  Other Topics Concern  . Not on file  Social History Narrative   One cup of coffee in morning .   Retired.   Lives at home with her husband Systems analyst).   Education - GED   Right handed.    Outpatient Encounter Medications as of 06/12/2018  Medication Sig  . aspirin 81 MG tablet Take 81 mg by mouth daily. Takes on Mondays and Wednesdays  . cholecalciferol (VITAMIN D) 1000 UNITS tablet Take 1,000 Units by mouth daily.  . Coenzyme Q10 (COQ10 PO) Take by mouth daily.    Marland Kitchen donepezil (ARICEPT) 5 MG tablet Take 1 tablet (5 mg total) by mouth at bedtime.  . folic acid (FOLVITE) 400 MCG tablet Take 400 mcg by  mouth daily.  . memantine (NAMENDA) 10 MG tablet Take 1 tablet (10 mg total) by mouth 2 (two) times daily for 30 days.  . Multiple Vitamin (MULTIVITAMIN PO) Take by mouth daily.    . risperiDONE (RISPERDAL) 1 MG tablet Take 1 tablet (1 mg total) by mouth 3 (three) times daily for 30 days.  . vitamin B-12 (CYANOCOBALAMIN) 1000 MCG tablet Take 1,000 mcg by mouth daily.     No facility-administered encounter medications on file as of 06/12/2018.     Allergies  Allergen Reactions  . Penicillins     Pt states not allergic, never had any reactions to PCN    Review of Systems  Unable to perform ROS: Dementia  Husband reports slight left foot swelling.    Observations/Objective: Pt has dementia.   Assessment and Plan: Diagnoses and all orders for this visit:  Left foot pain RICE. Can try over the counter tylenol or motrin for pain control report any new or worsening symptoms.     Follow Up Instructions: Report any new or worsening symptoms.     I discussed the assessment and treatment plan with the patient. The patient was provided an opportunity to ask questions and all were answered. The patient agreed with the plan and demonstrated an understanding of the instructions.   The patient was advised to call back or seek an in-person evaluation if the symptoms worsen or if the condition fails to improve as anticipated.  The above assessment and management plan was discussed with the patient. The patient verbalized understanding of and has agreed to the management plan. Patient is aware to call the clinic if symptoms persist or worsen. Patient is aware when to return to the clinic for a follow-up visit. Patient educated on when it is appropriate to go to the emergency department.    I provided 11 minutes of non-face-to-face time during this encounter. The call started at 1355. The call ended at 1406.   Kari Baars, FNP-C [facility] [address]  [place], [place] [address] [phone]

## 2018-06-16 ENCOUNTER — Other Ambulatory Visit: Payer: Self-pay | Admitting: *Deleted

## 2018-06-16 DIAGNOSIS — R451 Restlessness and agitation: Secondary | ICD-10-CM

## 2018-06-16 MED ORDER — DONEPEZIL HCL 5 MG PO TABS: 5.0000 mg | ORAL_TABLET | Freq: Every day | ORAL | 0 refills | Status: DC

## 2018-06-16 MED ORDER — MEMANTINE HCL 10 MG PO TABS: 10.0000 mg | ORAL_TABLET | Freq: Two times a day (BID) | ORAL | 0 refills | Status: DC

## 2018-09-23 ENCOUNTER — Other Ambulatory Visit: Payer: Self-pay | Admitting: Family Medicine

## 2018-09-23 DIAGNOSIS — R451 Restlessness and agitation: Secondary | ICD-10-CM

## 2018-09-29 ENCOUNTER — Other Ambulatory Visit: Payer: Self-pay | Admitting: Family Medicine

## 2018-09-29 ENCOUNTER — Telehealth: Payer: Self-pay | Admitting: Family Medicine

## 2018-09-29 DIAGNOSIS — G309 Alzheimer's disease, unspecified: Secondary | ICD-10-CM

## 2018-09-29 DIAGNOSIS — F0281 Dementia in other diseases classified elsewhere with behavioral disturbance: Secondary | ICD-10-CM

## 2018-09-29 MED ORDER — RISPERIDONE 1 MG PO TABS: 1.0000 mg | ORAL_TABLET | Freq: Three times a day (TID) | ORAL | 1 refills | Status: DC

## 2018-09-29 NOTE — Telephone Encounter (Signed)
Sent to [facility]

## 2018-09-29 NOTE — Telephone Encounter (Signed)
Husband aware.

## 2018-09-29 NOTE — Telephone Encounter (Signed)
What is the name of the medication? Risperidone 1 mg - Patient is out  Have you contacted your pharmacy to request a refill? YES  Which pharmacy would you like this sent to? [facility]   Patient notified that their request is being sent to the clinical staff for review and that they should receive a call once it is complete. If they do not receive a call within 24 hours they can check with their pharmacy or our office.

## 2018-10-09 ENCOUNTER — Other Ambulatory Visit: Payer: Self-pay | Admitting: Family Medicine

## 2018-12-09 ENCOUNTER — Other Ambulatory Visit: Payer: Self-pay

## 2018-12-10 ENCOUNTER — Ambulatory Visit (INDEPENDENT_AMBULATORY_CARE_PROVIDER_SITE_OTHER): Payer: Medicare HMO | Admitting: Family Medicine

## 2018-12-10 ENCOUNTER — Encounter: Payer: Self-pay | Admitting: Family Medicine

## 2018-12-10 VITALS — BP 100/61 | HR 90 | Temp 98.6°F | Resp 18 | Ht 63.0 in | Wt 129.0 lb

## 2018-12-10 DIAGNOSIS — G309 Alzheimer's disease, unspecified: Secondary | ICD-10-CM

## 2018-12-10 DIAGNOSIS — E538 Deficiency of other specified B group vitamins: Secondary | ICD-10-CM

## 2018-12-10 DIAGNOSIS — F0281 Dementia in other diseases classified elsewhere with behavioral disturbance: Secondary | ICD-10-CM | POA: Diagnosis not present

## 2018-12-10 DIAGNOSIS — R5383 Other fatigue: Secondary | ICD-10-CM

## 2018-12-10 DIAGNOSIS — Z23 Encounter for immunization: Secondary | ICD-10-CM | POA: Diagnosis not present

## 2018-12-10 DIAGNOSIS — E559 Vitamin D deficiency, unspecified: Secondary | ICD-10-CM

## 2018-12-10 DIAGNOSIS — R413 Other amnesia: Secondary | ICD-10-CM | POA: Diagnosis not present

## 2018-12-10 MED ORDER — DONEPEZIL HCL 10 MG PO TABS: 10.0000 mg | ORAL_TABLET | Freq: Every day | ORAL | 1 refills | Status: AC

## 2018-12-10 NOTE — Progress Notes (Signed)
Subjective:  Patient ID: Sharon Henry, female    DOB: 05/01/1945, 73 y.o.   MRN: [id number]  Patient Care Team: Baruch Gouty, FNP as PCP - General (Family Medicine)   Chief Complaint:  Medical Management of Chronic Issues (6 mo)   HPI: Sharon Henry is a 73 y.o. female presenting on 12/10/2018 for Medical Management of Chronic Issues (6 mo)   1. Alzheimer's dementia with behavioral disturbance, unspecified timing of dementia onset ([place])  Pt is nonverbal and husband is primary caregiver who is with her today. He provides history and ROS. Husband reports pt has had increased daytime fatigue and increased wondering at night. States she has been staying up all night and sleeping throughout the day. States he feels her nighttime medications may need to be increased. He denies decreased appetite or increased agitation. States she does not complain of any pain. No urinary symptoms.    2. Vitamin D deficiency  Pt is taking oral repletion therapy. Denies bone pain and tenderness, muscle weakness, fracture, and difficulty walking. Lab Results  Component Value Date   VD25OH 69.9 12/25/2012   Lab Results  Component Value Date   CALCIUM 9.8 08/29/2016      3. Vitamin B 12 deficiency  Pt is on oral repletion therapy. Pt has been experiencing increased daytime fatigue. Will recheck labs today.    5. Other fatigue  Increased daytime fatigue over the last few weeks. Husband reports she wonders most of the night and then sleeps during the day.       Relevant past medical, surgical, family, and social history reviewed and updated as indicated.  Allergies and medications reviewed and updated. Date reviewed: Chart in Epic.   Past Medical History:  Diagnosis Date  . Allergic rhinitis   . Diverticulosis of sigmoid colon 2005    small   . Glaucoma   . HLD (hyperlipidemia)   . Memory deficit 01/07/2013  . Sinusitis     Past Surgical History:  Procedure Laterality Date  .  COLONOSCOPY  2005;09/14/10   diverticulosis; diverticulosis and hemorrhoids  . NEPHRECTOMY     Left     Social History   Socioeconomic History  . Marital status: Married    Spouse name: Sharon Henry  . Number of children: 1  . Years of education: GED  . Highest education level: Not on file  Occupational History  . 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 Occupation: Retired   Scientific laboratory technician  . Financial resource strain: Not on file  . Food insecurity    Worry: Not on file    Inability: Not on file  . Transportation needs    Medical: Not on file    Non-medical: Not on file  Tobacco Use  . Smoking status: Never Smoker  . Smokeless tobacco: Never Used  Substance and Sexual Activity  . Alcohol use: No    Comment: One beer a week  . Drug use: No  . Sexual activity: Not on file  Lifestyle  . Physical activity    Days per week: Not on file    Minutes per session: Not on file  . Stress: Not on file  Relationships  . Social Herbalist on phone: Not on file    Gets together: Not on file    Attends religious service: Not on file    Active member of club or organization: Not on file  Attends meetings of clubs or organizations: Not on file    Relationship status: Not on file  . Intimate partner violence    Fear of current or ex partner: Not on file    Emotionally abused: Not on file    Physically abused: Not on file    Forced sexual activity: Not on file  Other Topics Concern  . Not on file  Social History Narrative   One cup of coffee in morning .   Retired.   Lives at home with her husband Careers adviser).   Education - GED   Right handed.    Outpatient Encounter Medications as of 12/10/2018  Medication Sig  . aspirin 81 MG tablet Take 81 mg by mouth daily. Takes on Mondays and Wednesdays  . cholecalciferol (VITAMIN D) 1000 UNITS tablet Take 1,000 Units by mouth daily.  . folic acid (FOLVITE) 381 MCG tablet Take 400 mcg by mouth daily.  .  memantine (NAMENDA) 10 MG tablet TAKE 1 TABLET TWICE DAILY  . Multiple Vitamin (MULTIVITAMIN PO) Take by mouth daily.    . risperiDONE (RISPERDAL) 1 MG tablet Take 1 tablet (1 mg total) by mouth 3 (three) times daily.  . vitamin B-12 (CYANOCOBALAMIN) 1000 MCG tablet Take 1,000 mcg by mouth daily.    . [DISCONTINUED] donepezil (ARICEPT) 5 MG tablet TAKE 1 TABLET EVERY DAY AT BEDTIME  . Coenzyme Q10 (COQ10 PO) Take by mouth daily.    Marland Kitchen donepezil (ARICEPT) 10 MG tablet Take 1 tablet (10 mg total) by mouth at bedtime.   No facility-administered encounter medications on file as of 12/10/2018.     Allergies  Allergen Reactions  . Penicillins     Pt states not allergic, never had any reactions to PCN    Review of Systems  Unable to perform ROS: Dementia (per husband)  Constitutional: Positive for fatigue. Negative for activity change, appetite change, chills, diaphoresis, fever and unexpected weight change.  HENT: Negative.   Respiratory: Negative for cough, chest tightness and shortness of breath.   Cardiovascular: Negative for chest pain, palpitations and leg swelling.  Gastrointestinal: Negative for blood in stool, constipation, diarrhea, nausea and vomiting.  Endocrine: Negative.   Genitourinary: Negative for decreased urine volume, difficulty urinating, dysuria, frequency and urgency.  Musculoskeletal: Negative for arthralgias and myalgias.  Skin: Negative.   Allergic/Immunologic: Negative.   Neurological: Negative for dizziness, tremors, seizures, syncope, facial asymmetry, speech difficulty, weakness, light-headedness, numbness and headaches.  Hematological: Negative.   Psychiatric/Behavioral: Positive for agitation, confusion and sleep disturbance. Negative for hallucinations and suicidal ideas.  All other systems reviewed and are negative.       Objective:  BP 100/61   Pulse 90   Temp 98.6 F (37 C)   Resp 18   Ht '5\' 3"'  (1.6 m)   Wt 129 lb (58.5 kg)   SpO2 100%   BMI  22.85 kg/m    Wt Readings from Last 3 Encounters:  12/10/18 129 lb (58.5 kg)  03/26/18 129 lb (58.5 kg)  01/27/18 139 lb (63 kg)    Physical Exam Vitals signs and nursing note reviewed.  Constitutional:      General: She is not in acute distress.    Appearance: Normal appearance. She is well-developed and well-groomed. She is not ill-appearing, toxic-appearing or diaphoretic.  HENT:     Head: Normocephalic and atraumatic.     Jaw: There is normal jaw occlusion.     Right Ear: Hearing normal.     Left Ear: Hearing normal.  Nose: Nose normal.     Mouth/Throat:     Lips: Pink.     Mouth: Mucous membranes are moist.     Pharynx: Oropharynx is clear. Uvula midline.  Eyes:     General: Lids are normal.     Extraocular Movements: Extraocular movements intact.     Conjunctiva/sclera: Conjunctivae normal.     Pupils: Pupils are equal, round, and reactive to light.  Neck:     Musculoskeletal: Normal range of motion and neck supple.     Thyroid: No thyroid mass, thyromegaly or thyroid tenderness.     Vascular: No carotid bruit or JVD.     Trachea: Trachea and phonation normal.  Cardiovascular:     Rate and Rhythm: Normal rate and regular rhythm.     Chest Wall: PMI is not displaced.     Pulses: Normal pulses.     Heart sounds: Normal heart sounds. No murmur. No friction rub. No gallop.   Pulmonary:     Effort: Pulmonary effort is normal. No respiratory distress.     Breath sounds: Normal breath sounds. No wheezing.  Abdominal:     General: Bowel sounds are normal. There is no distension or abdominal bruit.     Palpations: Abdomen is soft. There is no hepatomegaly or splenomegaly.     Tenderness: There is no abdominal tenderness. There is no right CVA tenderness or left CVA tenderness.     Hernia: No hernia is present.  Musculoskeletal: Normal range of motion.     Right lower leg: No edema.     Left lower leg: No edema.  Lymphadenopathy:     Cervical: No cervical adenopathy.   Skin:    General: Skin is warm and dry.     Capillary Refill: Capillary refill takes less than 2 seconds.     Coloration: Skin is not cyanotic, jaundiced or pale.     Findings: No rash.  Neurological:     Mental Status: She is alert. Mental status is at baseline.     Cranial Nerves: Cranial nerves are intact.     Sensory: Sensation is intact.     Motor: Motor function is intact.     Coordination: Coordination is intact.     Gait: Gait is intact.     Deep Tendon Reflexes: Reflexes are normal and symmetric.  Psychiatric:        Attention and Perception: She is inattentive.        Mood and Affect: Affect is flat.        Speech: She is noncommunicative.        Behavior: Behavior is withdrawn. Behavior is cooperative.     Comments: Pt nonverbal, this is baseline for pt     Results for orders placed or performed in visit on 05/27/18  PPD  Result Value Ref Range   TB Skin Test Negative    Induration 0 mm       Pertinent labs & imaging results that were available during my care of the patient were reviewed by me and considered in my medical decision making.  Assessment & Plan:  Rella was seen today for medical management of chronic issues.  Diagnoses and all orders for this visit:  Alzheimer's dementia with behavioral disturbance, unspecified timing of dementia onset ([place]) Increased wondering at night and increased fatigue during the day. Will trial increasing Aricept to 10 mg nightly. Follow up in 4-6 weeks for reevaluation.  -     donepezil (ARICEPT) 10 MG tablet;  Take 1 tablet (10 mg total) by mouth at bedtime. -     CBC with Differential/Platelet -     CMP14+EGFR  Vitamin D deficiency Labs pending. Continue repletion therapy. If indicated, will change repletion dosage. Eat foods rich in Vit D including milk, orange juice, yogurt with vitamin D added, salmon or mackerel, canned tuna fish, cereals with vitamin D added, and cod liver oil. Get out in the sun but make sure to  wear at least SPF 30 sunscreen.  -     VITAMIN D 25 Hydroxy (Vit-D Deficiency, Fractures)  Vitamin B 12 deficiency Will check labs today. Continue repletion therapy.  -     VITAMIN D 25 Hydroxy (Vit-D Deficiency, Fractures) -     Thyroid Panel With TSH -     Vitamin B12  Other fatigue Will change Aricept to 10 mg at night to see if this will decrease nighttime wondering in turn decreasing daytime fatigue. Will check below.  -     CBC with Differential/Platelet -     CMP14+EGFR -     VITAMIN D 25 Hydroxy (Vit-D Deficiency, Fractures) -     Thyroid Panel With TSH -     Vitamin B12  Need for immunization against influenza -     Flu Vaccine QUAD High Dose(Fluad)     Continue all other maintenance medications.  Follow up plan: Return in about 3 months (around 03/11/2019), or if symptoms worsen or fail to improve, for Alzheimer .  Continue healthy lifestyle choices, including diet (rich in fruits, vegetables, and lean proteins, and low in salt and simple carbohydrates) and exercise (at least 30 minutes of moderate physical activity daily).  Educational handout given for Alzheimer's Disease  The above assessment and management plan was discussed with the patient. The patient verbalized understanding of and has agreed to the management plan. Patient is aware to call the clinic if they develop any new symptoms or if symptoms persist or worsen. Patient is aware when to return to the clinic for a follow-up visit. Patient educated on when it is appropriate to go to the emergency department.   Monia Pouch, FNP-C [facility] [phone]

## 2018-12-10 NOTE — Patient Instructions (Signed)
Alzheimer's Disease Alzheimer's disease is a brain disease that affects memory, thinking, language, and behavior. People with Alzheimer's disease lose mental abilities, and the disease gets worse over time. Alzheimer's disease is a form of dementia. What are the causes? This condition develops when a protein called beta-amyloid forms deposits in the brain. It is not known what causes these deposits to form. Alzheimer's disease may also be caused by a gene mutation that is inherited from one parent or both parents. A gene mutation is a harmful change in a gene. Not everyone who inherits the genetic mutation will get the disease. What increases the risk? You are more likely to develop this condition if you:  Are older than age 65.  Have a family history of dementia.  Have had a brain injury.  Have heart or blood vessel disease.  Have had a stroke.  Have high blood pressure or high cholesterol.  Have diabetes. What are the signs or symptoms? Symptoms of this condition may happen in three stages, which often overlap. Early stage In this stage, you may continue to be independent. You may still be able to drive, work, and be social. Symptoms in this stage include:  Minor memory problems, such as forgetting a name or what you read.  Difficulty with: ? Paying attention. ? Communicating. ? Doing familiar tasks. ? Problem solving or doing calculations. ? Following instructions. ? Learning new things.  Anxiety.  Social withdrawal.  Loss of motivation. Moderate stage In this stage, you will start to need care. Symptoms in this stage include:  Difficulty with expressing thoughts.  Memory loss that affects daily life. This can include forgetting: ? Your address or phone number. ? Recent events that have happened. ? Parts of your personal history, such as where you went to school.  Confusion about where you are or what time it is.  Difficulty in judging distance.  Changes in  personality, mood, and behavior. You may be moody, irritable, angry, frustrated, fearful, anxious, or suspicious.  Poor reasoning and judgment.  Delusions or hallucinations.  Changes in sleep patterns.  Wandering and getting lost, even in familiar places. Severe stage In the final stage, you will need help with your personal care and daily activities. Symptoms in this stage include:  Worsening memory loss.  Personality changes.  Loss of awareness of your surroundings.  Changes in physical abilities, including the ability to walk, sit, and swallow.  Difficulty in communicating.  Inability to control your bladder and bowels.  Increasing confusion.  Increasing behavior changes. How is this diagnosed? This condition is diagnosed by a health care provider who specializes in diseases of the nervous system (neurologist). Other causes of dementia may also be ruled out. Your health care provider will talk with you and your family, friends, or caregivers about your history and symptoms. A thorough medical history will be taken, and you will have a physical exam and tests. Tests may include:  Lab tests, such as blood or urine tests.  Imaging tests, such as a CT scan, a PET scan, or an MRI.  A lumbar puncture. This test involves removing and testing a small amount of the fluid that surrounds the brain and spinal cord.  An electroencephalogram (EEG). In this test, small metal discs are used to measure electrical activity in the brain.  Memory tests, cognitive tests, and neuropsychological tests. These tests evaluate brain function.  Genetic testing may be done if you have early onset of the disease (before age 60) or if   other family members have the disease. How is this treated? At this time, there is no treatment to cure Alzheimer's disease or stop it from getting worse. The goals of treatment are:  To slow down symptoms of the disease, if possible.  To manage behavioral changes.   To provide you with a safe environment.  To help manage daily life for you and your caregivers. The following treatment options are available:  Medicines. Medicines may help to slow down memory loss and manage behavioral symptoms.  Cognitive therapy. Cognitive therapy provides you with education, support, and memory aids. It is most helpful in the early stages of the condition.  Counseling or spiritual guidance. It is normal to have a lot of feelings, including anger, relief, fear, and isolation. Counseling and guidance can help you deal with these feelings.  Caregiving. This involves having caregivers help you with your daily activities.  Family support groups. These provide education, emotional support, and information about community resources to family members who are taking care of you. Follow these instructions at home:  Medicines  Take over-the-counter and prescription medicines only as told by your health care provider.  Use a pill organizer or pill reminder to help you manage your medicines.  Avoid taking medicines that can affect thinking, such as pain medicines or sleeping medicines. Lifestyle  Make healthy lifestyle choices: ? Be physically active as told by your health care provider. Regular exercise may help improve symptoms. ? Do not use any products that contain nicotine or tobacco, such as cigarettes, e-cigarettes, and chewing tobacco. If you need help quitting, ask your health care provider. ? Do not drink alcohol. ? Eat a healthy diet. ? Practice stress-management techniques when you get stressed. ? Stay social.  Drink enough fluid to keep your urine pale yellow.  Make sure to get quality sleep. ? Avoid taking long naps during the day. Take short naps of 30 minutes or less if needed. ? Keep your sleeping area dark and cool. ? Avoid exercising during the few hours before you go to bed. ? Avoid caffeine products in the afternoon and evening. General  instructions  Work with your health care provider to determine what you need help with and what your safety needs are.  If you were given a bracelet that identifies you as a person with memory loss or tracks your location, make sure to wear it at all times.  Talk with your health care provider about whether it is safe for you to drive.  Work with your family to make important decisions, such as advance directives, medical power of attorney, or a living will.  Keep all follow-up visits as told by your health care provider. This is important. Where to find more information  The Alzheimer's Association: Call the 24-hour helpline at 1-(210) 684-4603, or visit [employer] Contact a health care provider if:  You have nausea, vomiting, or trouble with eating.  You have dizziness or weakness.  You or your family members become concerned for your safety. Get help right away if:  You feel depressed or sad, or feel that you want to harm yourself.  You develop chest pain or difficulty with breathing.  You pass out. If you ever feel like you may hurt yourself or others, or have thoughts about taking your own life, get help right away. You can go to your nearest emergency department or call:  Your local emergency services (911 in the [place]  A suicide crisis helpline, such as the National Suicide Prevention  Lifeline at 775-526-4104. This is open 24 hours a day. Summary  Alzheimer's disease is a brain disease that affects memory, thinking, language, and behavior. Alzheimer's disease is a form of dementia.  This condition is diagnosed by a specialist in diseases of the nervous system (neurologist).  At this time, there is no treatment to cure Alzheimer's disease or stop it from getting worse. The goals of treatment are to slow memory loss and help you manage any symptoms.  Work with your family to make important decisions, such as advance directives, medical power of attorney, or a living  will. This information is not intended to replace advice given to you by your health care provider. Make sure you discuss any questions you have with your health care provider. Document Released: 11/15/2003 Document Revised: 02/11/2018 Document Reviewed: 02/11/2018 Elsevier Patient Education  2020 [place] Disease Caregiver Guide  Alzheimer disease causes a person to lose the ability to remember things and make decisions. A person who has Alzheimer disease may not be able to take care of himself or herself. He or she may need help with simple tasks. The tips below can help you care for the person. What kind of changes does this condition cause? This condition makes a person:  Forget things.  Feel confused.  Act differently.  Have different moods. These things get worse with time. Tips to help with symptoms  Be calm and patient.  Respond with a simple, short answer.  Avoid correcting the person in a negative way.  Try not to take things personally, even if the person forgets your name.  Do not argue with the person. This may make the person more upset. Tips to lessen frustration  Make appointments and do daily tasks when the person is at his or her best.  Take your time. Simple tasks may take longer. Allow plenty of time to complete tasks.  Limit choices for the person.  Involve the person in what you are doing.  Keep a daily routine.  Avoid new or crowded places, if possible.  Use simple words, short sentences, and a calm voice. Only give one direction at a time.  Buy clothes and shoes that are easy to put on and take off.  Organize medicines in a pillbox for each day of the week.  Keep a calendar in a central location to remind the person of meetings or other activities.  Let people help if they offer. Take a break when needed. Tips to prevent injury  Keep floors clear. Remove rugs, magazine racks, and floor lamps.  Keep hallways well-lit.   Put a handrail and non-slip mat in the bathtub or shower.  Put childproof locks on cabinets that have dangerous items in them. These items include medicine, alcohol, guns, toxic cleaning items, sharp tools, matches, and lighters.  Put locks on doors where the person cannot see or reach them. This helps the person to not wander out of the house and get lost.  Be prepared for emergencies. Keep a list of emergency phone numbers and addresses close by.  Bracelets may be worn that track location and identify the person as having memory problems. This should be worn at all times for safety. Tips for the future  Discuss financial and legal planning early. People with this disease have trouble managing their money as the disease gets worse. Get help from a professional.  Talk about advance directives, safety, and daily care. Take these steps: ? Create a living will and choose  a power of attorney. This is someone who can make decisions for the person with Alzheimer disease when he or she can no longer do so. ? Discuss driving safety and when to stop driving. The person's doctor can help with this. ? If the person lives alone, make sure he or she is safe. Some people need extra help at home. Other people need more care at a nursing home or care center. Where to find support You can find support by joining a support group near you. Some benefits of joining a support group include:  Learning ways to manage stress.  Sharing experiences with others.  Getting emotional comfort and support.  Learning about caregiving as the disease progresses.  Knowing what community resources are available and making use of them. Where to find more information  Alzheimer's Association: [employer]hu Contact a doctor if:  The person has a fever.  The person has a sudden behavior change that does not get better with calming strategies.  The person is not able to take care of himself or herself at home.  The  person threatens you or anyone else, including himself or herself.  You are no longer able to care for the person. Summary  Alzheimer disease causes a person to forget things and to be confused.  A person who has this condition may not be able to take care of himself or herself.  Take steps to keep the person from getting hurt. Plan for future care.  You can find support by joining a support group near you. This information is not intended to replace advice given to you by your health care provider. Make sure you discuss any questions you have with your health care provider. Document Released: 05/28/2011 Document Revised: 06/24/2018 Document Reviewed: 02/28/2017 Elsevier Patient Education  2020 [employer].

## 2018-12-11 LAB — CMP14+EGFR
ALT: 28 IU/L (ref 0–32)
AST: 44 IU/L — ABNORMAL HIGH (ref 0–40)
Albumin/Globulin Ratio: 1.6 (ref 1.2–2.2)
Albumin: 3.9 g/dL (ref 3.8–4.8)
Alkaline Phosphatase: 97 IU/L (ref 39–117)
BUN/Creatinine Ratio: 14 (ref 12–28)
BUN: 13 mg/dL (ref 8–27)
Bilirubin Total: 0.2 mg/dL (ref 0.0–1.2)
CO2: 22 mmol/L (ref 20–29)
Calcium: 9.6 mg/dL (ref 8.7–10.3)
Chloride: 101 mmol/L (ref 96–106)
Creatinine, Ser: 0.9 mg/dL (ref 0.57–1.00)
GFR calc Af Amer: 75 mL/min/{1.73_m2} (ref >59)
GFR calc non Af Amer: 65 mL/min/{1.73_m2} (ref >59)
Globulin, Total: 2.5 g/dL (ref 1.5–4.5)
Glucose: 141 mg/dL — ABNORMAL HIGH (ref 65–99)
Potassium: 3.9 mmol/L (ref 3.5–5.2)
Sodium: 140 mmol/L (ref 134–144)
Total Protein: 6.4 g/dL (ref 6.0–8.5)

## 2018-12-11 LAB — THYROID PANEL WITH TSH
Free Thyroxine Index: 2.2 (ref 1.2–4.9)
T3 Uptake Ratio: 27 % (ref 24–39)
T4, Total: 8.3 ug/dL (ref 4.5–12.0)
TSH: 1.31 u[IU]/mL (ref 0.450–4.500)

## 2018-12-11 LAB — CBC WITH DIFFERENTIAL/PLATELET
Basophils Absolute: 0 10*3/uL (ref 0.0–0.2)
Basos: 0 %
EOS (ABSOLUTE): 0.1 10*3/uL (ref 0.0–0.4)
Eos: 1 %
Hematocrit: 37.8 % (ref 34.0–46.6)
Hemoglobin: 13.2 g/dL (ref 11.1–15.9)
Immature Grans (Abs): 0 10*3/uL (ref 0.0–0.1)
Immature Granulocytes: 0 %
Lymphocytes Absolute: 1.2 10*3/uL (ref 0.7–3.1)
Lymphs: 17 %
MCH: 31.3 pg (ref 26.6–33.0)
MCHC: 34.9 g/dL (ref 31.5–35.7)
MCV: 90 fL (ref 79–97)
Monocytes Absolute: 0.6 10*3/uL (ref 0.1–0.9)
Monocytes: 8 %
Neutrophils Absolute: 4.9 10*3/uL (ref 1.4–7.0)
Neutrophils: 74 %
Platelets: 316 10*3/uL (ref 150–450)
RBC: 4.22 x10E6/uL (ref 3.77–5.28)
RDW: 12 % (ref 11.7–15.4)
WBC: 6.8 10*3/uL (ref 3.4–10.8)

## 2018-12-11 LAB — VITAMIN B12: Vitamin B-12: 1597 pg/mL — ABNORMAL HIGH (ref 232–1245)

## 2018-12-11 LAB — VITAMIN D 25 HYDROXY (VIT D DEFICIENCY, FRACTURES): Vit D, 25-Hydroxy: 43.8 ng/mL (ref 30.0–100.0)

## 2018-12-15 ENCOUNTER — Other Ambulatory Visit: Payer: Self-pay | Admitting: *Deleted

## 2018-12-15 DIAGNOSIS — F0281 Dementia in other diseases classified elsewhere with behavioral disturbance: Secondary | ICD-10-CM

## 2018-12-15 MED ORDER — RISPERIDONE 1 MG PO TABS: 1.0000 mg | ORAL_TABLET | Freq: Three times a day (TID) | ORAL | 1 refills | Status: DC

## 2018-12-18 ENCOUNTER — Other Ambulatory Visit: Payer: Self-pay

## 2018-12-18 ENCOUNTER — Telehealth: Payer: Self-pay | Admitting: Family Medicine

## 2018-12-18 ENCOUNTER — Telehealth: Payer: Self-pay | Admitting: *Deleted

## 2018-12-18 DIAGNOSIS — F0281 Dementia in other diseases classified elsewhere with behavioral disturbance: Secondary | ICD-10-CM

## 2018-12-18 DIAGNOSIS — G309 Alzheimer's disease, unspecified: Secondary | ICD-10-CM

## 2018-12-18 MED ORDER — RISPERIDONE 1 MG PO TABS: 1.0000 mg | ORAL_TABLET | Freq: Three times a day (TID) | ORAL | 1 refills | Status: AC

## 2018-12-18 NOTE — Telephone Encounter (Signed)
Med resent.  Patient's husband aware.

## 2018-12-18 NOTE — Telephone Encounter (Signed)
Prior Auth for Risperidone 1mg  tab-In Process  Key: [id number] -   PA Case ID: [id number]  Your information has been submitted to [facility]. [employer] will review the request and will issue a decision, typically within 3-7 days from your submission. You can check the updated outcome later by reopening this request.  If [employer] has not responded in 3-7 days or if you have any questions about your ePA request, please contact [employer] at [phone]. If you think there may be a problem with your PA request, use our live chat feature at the bottom right.  For [place] requests, please call [phone].

## 2018-12-18 NOTE — Telephone Encounter (Signed)
Spouse states that Sharon Henry does not have rx for risperiDONE (RISPERDAL) 1 MG tablet. Could it be that he needs a pa? Please call back

## 2018-12-19 ENCOUNTER — Other Ambulatory Visit: Payer: Self-pay | Admitting: Family Medicine

## 2018-12-19 ENCOUNTER — Encounter: Payer: Self-pay | Admitting: Physician Assistant

## 2018-12-19 ENCOUNTER — Ambulatory Visit (INDEPENDENT_AMBULATORY_CARE_PROVIDER_SITE_OTHER): Payer: Medicare HMO | Admitting: Physician Assistant

## 2018-12-19 ENCOUNTER — Ambulatory Visit (INDEPENDENT_AMBULATORY_CARE_PROVIDER_SITE_OTHER): Payer: Medicare HMO

## 2018-12-19 ENCOUNTER — Other Ambulatory Visit: Payer: Self-pay | Admitting: Physician Assistant

## 2018-12-19 VITALS — BP 120/85 | HR 69 | Temp 98.2°F | Ht 63.0 in | Wt 128.0 lb

## 2018-12-19 DIAGNOSIS — M546 Pain in thoracic spine: Secondary | ICD-10-CM

## 2018-12-19 DIAGNOSIS — M542 Cervicalgia: Secondary | ICD-10-CM | POA: Diagnosis not present

## 2018-12-19 MED ORDER — DICLOFENAC SODIUM 50 MG PO TBEC: 50.0000 mg | DELAYED_RELEASE_TABLET | Freq: Two times a day (BID) | ORAL | 0 refills | Status: AC

## 2018-12-19 NOTE — Progress Notes (Signed)
BP 120/85   Pulse 69   Temp 98.2 F (36.8 C) (Temporal)   Ht _0  (1.6 m)   Wt 128 lb (58.1 kg)   SpO2 96%   BMI 22.67 kg/m    Subjective:    Patient ID: Sharon Henry, female    DOB: 05/27/1945, 73 y.o.   MRN: [id number]  HPI: Sharon Henry is a 73 y.o. female presenting on 12/19/2018 for Neck Pain   The patient is accompanied by her husband today.  She is nonverbal with dementia.  Over the past couple weeks she has had increasing bending the neck and thoracic area.  She tells him that she does not hurt.  However here she is not sleeping on a pillow that she normally would.  And because of her dementia they are not able to use a heating pad or anything to help her be comfortable.  They have not given her any medicine at this time.  She does not have any prior history of spine degeneration or injuries.  I reviewed her kidney functions and they were good at her last check in order to try an NSAID first.  We will also obtain x-ray of cervical and thoracic spines.    Past Medical History:  Diagnosis Date  . Allergic rhinitis   . Diverticulosis of sigmoid colon 2005    small   . Glaucoma   . HLD (hyperlipidemia)   . Memory deficit 01/07/2013  . Sinusitis    Relevant past medical, surgical, family and social history reviewed and updated as indicated. Interim medical history since our last visit reviewed. Allergies and medications reviewed and updated. DATA REVIEWED: CHART IN EPIC  Family History reviewed for pertinent findings.  Review of Systems  Constitutional: Negative.   HENT: Negative.   Eyes: Negative.   Respiratory: Negative.   Gastrointestinal: Negative.   Genitourinary: Negative.   Musculoskeletal: Positive for arthralgias, neck pain and neck stiffness.    Allergies as of 12/19/2018      Reactions   Penicillins    Pt states not allergic, never had any reactions to PCN      Medication List       Accurate as of December 19, 2018 10:55 AM. If you  have any questions, ask your nurse or doctor.        aspirin 81 MG tablet Take 81 mg by mouth daily. Takes on Mondays and Wednesdays   cholecalciferol 1000 units tablet Commonly known as: VITAMIN D Take 1,000 Units by mouth daily.   COQ10 PO Take by mouth daily.   donepezil 10 MG tablet Commonly known as: Aricept Take 1 tablet (10 mg total) by mouth at bedtime.   folic acid 248 MCG tablet Commonly known as: FOLVITE Take 400 mcg by mouth daily.   memantine 10 MG tablet Commonly known as: NAMENDA TAKE 1 TABLET TWICE DAILY   MULTIVITAMIN PO Take by mouth daily.   risperiDONE 1 MG tablet Commonly known as: RisperDAL Take 1 tablet (1 mg total) by mouth 3 (three) times daily.   vitamin B-12 1000 MCG tablet Commonly known as: CYANOCOBALAMIN Take 1,000 mcg by mouth daily.          Objective:    BP 120/85   Pulse 69   Temp 98.2 F (36.8 C) (Temporal)   Ht _1  (1.6 m)   Wt 128 lb (58.1 kg)   SpO2 96%   BMI 22.67 kg/m   Allergies  Allergen Reactions  .  Penicillins     Pt states not allergic, never had any reactions to PCN    Wt Readings from Last 3 Encounters:  12/19/18 128 lb (58.1 kg)  12/10/18 129 lb (58.5 kg)  03/26/18 129 lb (58.5 kg)    Physical Exam Constitutional:      General: She is not in acute distress.    Appearance: Normal appearance. She is well-developed.  HENT:     Head: Normocephalic and atraumatic.  Cardiovascular:     Rate and Rhythm: Normal rate.  Pulmonary:     Effort: Pulmonary effort is normal.  Musculoskeletal:     Cervical back: She exhibits decreased range of motion and spasm.     Comments: The patient is bent forward at the neck and upper thoracic area.  There is definite spasm of all the muscles along the spine.  She is not showing any it is tenderness.  She does walk around the room the entire time that we are examining.  She does not appear to be in any acute distress.  Skin:    General: Skin is warm and dry.      Findings: No rash.  Neurological:     Mental Status: She is alert. She is disoriented.     Deep Tendon Reflexes: Reflexes are normal and symmetric.     Results for orders placed or performed in visit on 12/10/18  CBC with Differential/Platelet  Result Value Ref Range   WBC 6.8 3.4 - 10.8 x10E3/uL   RBC 4.22 3.77 - 5.28 x10E6/uL   Hemoglobin 13.2 11.1 - 15.9 g/dL   Hematocrit 37.8 34.0 - 46.6 %   MCV 90 79 - 97 fL   MCH 31.3 26.6 - 33.0 pg   MCHC 34.9 31.5 - 35.7 g/dL   RDW 12.0 11.7 - 15.4 %   Platelets 316 150 - 450 x10E3/uL   Neutrophils 74 Not Estab. %   Lymphs 17 Not Estab. %   Monocytes 8 Not Estab. %   Eos 1 Not Estab. %   Basos 0 Not Estab. %   Neutrophils Absolute 4.9 1.4 - 7.0 x10E3/uL   Lymphocytes Absolute 1.2 0.7 - 3.1 x10E3/uL   Monocytes Absolute 0.6 0.1 - 0.9 x10E3/uL   EOS (ABSOLUTE) 0.1 0.0 - 0.4 x10E3/uL   Basophils Absolute 0.0 0.0 - 0.2 x10E3/uL   Immature Granulocytes 0 Not Estab. %   Immature Grans (Abs) 0.0 0.0 - 0.1 x10E3/uL  CMP14+EGFR  Result Value Ref Range   Glucose 141 (H) 65 - 99 mg/dL   BUN 13 8 - 27 mg/dL   Creatinine, Ser 0.90 0.57 - 1.00 mg/dL   GFR calc non Af Amer 65 >59 mL/min/1.73   GFR calc Af Amer 75 >59 mL/min/1.73   BUN/Creatinine Ratio 14 12 - 28   Sodium 140 134 - 144 mmol/L   Potassium 3.9 3.5 - 5.2 mmol/L   Chloride 101 96 - 106 mmol/L   CO2 22 20 - 29 mmol/L   Calcium 9.6 8.7 - 10.3 mg/dL   Total Protein 6.4 6.0 - 8.5 g/dL   Albumin 3.9 3.8 - 4.8 g/dL   Globulin, Total 2.5 1.5 - 4.5 g/dL   Albumin/Globulin Ratio 1.6 1.2 - 2.2   Bilirubin Total 0.2 0.0 - 1.2 mg/dL   Alkaline Phosphatase 97 39 - 117 IU/L   AST 44 (H) 0 - 40 IU/L   ALT 28 0 - 32 IU/L  VITAMIN D 25 Hydroxy (Vit-D Deficiency, Fractures)  Result Value Ref Range  Vit D, 25-Hydroxy 43.8 30.0 - 100.0 ng/mL  Thyroid Panel With TSH  Result Value Ref Range   TSH 1.310 0.450 - 4.500 uIU/mL   T4, Total 8.3 4.5 - 12.0 ug/dL   T3 Uptake Ratio 27 24 - 39 %    Free Thyroxine Index 2.2 1.2 - 4.9  Vitamin B12  Result Value Ref Range   Vitamin B-12 1,597 (H) 232 - 1,245 pg/mL      Assessment & Plan:   1. Neck pain - DG Cervical Spine Complete; Future  2. Acute midline thoracic back pain - DG Thoracic Spine 2 View; Future   Continue all other maintenance medications as listed above.  Follow up plan: No follow-ups on file.  Educational handout given for neck exercises  Terald Sleeper PA-C [facility] [address]  [place], [place] [address] [phone]   12/19/2018, 10:55 AM

## 2018-12-19 NOTE — Patient Instructions (Signed)

## 2018-12-20 ENCOUNTER — Other Ambulatory Visit: Payer: Self-pay

## 2018-12-20 ENCOUNTER — Emergency Department (HOSPITAL_COMMUNITY)
Admission: EM | Admit: 2018-12-20 | Discharge: 2019-01-18 | Disposition: E | Payer: Medicare HMO | Attending: Emergency Medicine | Admitting: Emergency Medicine

## 2018-12-20 ENCOUNTER — Encounter (HOSPITAL_COMMUNITY): Payer: Self-pay

## 2018-12-20 DIAGNOSIS — I959 Hypotension, unspecified: Secondary | ICD-10-CM | POA: Diagnosis not present

## 2018-12-20 DIAGNOSIS — I469 Cardiac arrest, cause unspecified: Secondary | ICD-10-CM | POA: Diagnosis not present

## 2018-12-20 DIAGNOSIS — R0689 Other abnormalities of breathing: Secondary | ICD-10-CM | POA: Diagnosis not present

## 2018-12-20 DIAGNOSIS — R404 Transient alteration of awareness: Secondary | ICD-10-CM | POA: Insufficient documentation

## 2018-12-20 DIAGNOSIS — F0390 Unspecified dementia without behavioral disturbance: Secondary | ICD-10-CM | POA: Insufficient documentation

## 2018-12-20 DIAGNOSIS — Z7982 Long term (current) use of aspirin: Secondary | ICD-10-CM | POA: Diagnosis not present

## 2018-12-20 DIAGNOSIS — R4020 Unspecified coma: Secondary | ICD-10-CM | POA: Diagnosis not present

## 2018-12-20 DIAGNOSIS — I468 Cardiac arrest due to other underlying condition: Secondary | ICD-10-CM | POA: Diagnosis not present

## 2018-12-20 DIAGNOSIS — R4182 Altered mental status, unspecified: Secondary | ICD-10-CM | POA: Diagnosis not present

## 2018-12-20 HISTORY — DX: Unspecified dementia, unspecified severity, without behavioral disturbance, psychotic disturbance, mood disturbance, and anxiety: F03.90

## 2018-12-20 LAB — CBG MONITORING, ED: Glucose-Capillary: 115 mg/dL — ABNORMAL HIGH (ref 70–99)

## 2018-12-20 MED ORDER — EPINEPHRINE 1 MG/10ML IJ SOSY
PREFILLED_SYRINGE | INTRAMUSCULAR | Status: AC | PRN
  Administered 2018-12-20 (×6): 0.1 mg via INTRAVENOUS

## 2018-12-20 MED ORDER — ATROPINE SULFATE 1 MG/ML IJ SOLN
INTRAMUSCULAR | Status: AC | PRN
  Administered 2018-12-20: 1 mg via INTRAVENOUS

## 2018-12-20 MED ORDER — LIDOCAINE IN D5W 4-5 MG/ML-% IV SOLN
INTRAVENOUS | Status: AC | PRN
  Administered 2018-12-20: 100 mg/min via INTRAVENOUS

## 2018-12-22 MED FILL — Medication: Qty: 1 | Status: AC

## 2019-01-18 NOTE — ED Notes (Signed)
Medical examiner okay to send patient to [facility] of choice.

## 2019-01-18 NOTE — Discharge Instructions (Addendum)
Continue her current treatments at home, as planned.  Encourage her to eat and drink regularly.  Call her primary care doctor for follow-up appointment if not improving.

## 2019-01-18 NOTE — ED Notes (Signed)
Patient finished one container of applesauce, a full cup of ginger ale and some graham crackers.

## 2019-01-18 NOTE — Code Documentation (Signed)
EDP at bedside. Stop medications and compressions per EDP. Agonal respirations noted. Stop ACLS per EDP.

## 2019-01-18 NOTE — ED Provider Notes (Signed)
[facility] EMERGENCY DEPARTMENT Provider Note   CSN: [id number] Arrival date & time:        History   Chief Complaint Chief Complaint  Patient presents with  . Altered Mental Status    HPI Sharon PostLinda M Henry is a 73 y.o. female.     HPI   Patient brought in by EMS for evaluation.  Her husband called EMS because she was less responsive today than usual.  He also noticed that for the last several days she has been walking around with her head down, and seems to have trouble lifting it.  He feels like she was more sleepy than usual yesterday.  Since arrival to the ED, she has woken up, lifted her head up and has been acting normally.  Patient has a history of dementia.  She is unable to contribute to history.  She has not been ill recently.  EMS reportedly gave her a dose of atropine, because "heart rate was in the 40s," and she was treated with 300 cc of saline during transport.  There is no report of hypotension in the field.  Level 5 caveat-dementia  Past Medical History:  Diagnosis Date  . Allergic rhinitis   . Dementia (HCC)   . Diverticulosis of sigmoid colon 2005    small   . Glaucoma   . HLD (hyperlipidemia)   . Memory deficit 01/07/2013  . Sinusitis     Patient Active Problem List   Diagnosis Date Noted  . Vitamin D deficiency 12/10/2018  . Vitamin B 12 deficiency 12/10/2018  . Alzheimer's dementia with behavioral disturbance (HCC) 08/30/2015  . Memory deficit 01/07/2013  . Constipation 09/01/2010    Past Surgical History:  Procedure Laterality Date  . COLONOSCOPY  2005;09/14/10   diverticulosis; diverticulosis and hemorrhoids  . NEPHRECTOMY     Left      OB History   No obstetric history on file.      Home Medications    Prior to Admission medications   Medication Sig Start Date End Date Taking? Authorizing Provider  aspirin 81 MG tablet Take 81 mg by mouth daily. Takes on Mondays and Wednesdays   Yes [provider]  cholecalciferol  (VITAMIN D) 1000 UNITS tablet Take 1,000 Units by mouth daily.   Yes [provider]  Coenzyme Q10 (COQ10 PO) Take by mouth daily.     Yes [provider]  diclofenac (VOLTAREN) 50 MG EC tablet Take 1 tablet (50 mg total) by mouth 2 (two) times daily. 12/19/18  Yes Remus LofflerJones, Angel S, PA-C  donepezil (ARICEPT) 10 MG tablet Take 1 tablet (10 mg total) by mouth at bedtime. 12/10/18 03/10/19 Yes Rakes, Doralee AlbinoLinda M, FNP  folic acid (FOLVITE) 400 MCG tablet Take 400 mcg by mouth daily.   Yes [provider]  memantine (NAMENDA) 10 MG tablet TAKE 1 TABLET TWICE DAILY 12/19/18  Yes Rakes, Doralee AlbinoLinda M, FNP  Multiple Vitamin (MULTIVITAMIN PO) Take by mouth daily.     Yes [provider]  risperiDONE (RISPERDAL) 1 MG tablet Take 1 tablet (1 mg total) by mouth 3 (three) times daily. 12/18/18 01/17/19 Yes Rakes, Doralee AlbinoLinda M, FNP  vitamin B-12 (CYANOCOBALAMIN) 1000 MCG tablet Take 1,000 mcg by mouth daily.     Yes [provider]    Family History Family History  Problem Relation Age of Onset  . Heart attack Father 6452  . COPD Mother   . Heart attack Sister 9450  . Diabetes Brother   . Ovarian cancer  Maternal Aunt   . Colon cancer Neg Hx     Social History Social History   Tobacco Use  . Smoking status: Never Smoker  . Smokeless tobacco: Never Used  Substance Use Topics  . Alcohol use: No    Comment: One beer a week  . Drug use: No     Allergies   Penicillins   Review of Systems Review of Systems  Unable to perform ROS: Dementia     Physical Exam Updated Vital Signs BP 120/75   Pulse 63   Temp (!) 97.5 F (36.4 C) (Oral)   Resp 14   SpO2 98%   Physical Exam Vitals signs and nursing note reviewed.  Constitutional:      General: She is not in acute distress.    Appearance: She is well-developed. She is not ill-appearing, toxic-appearing or diaphoretic.  HENT:     Head: Normocephalic and atraumatic.     Right Ear: External ear normal.     Left Ear:  External ear normal.  Eyes:     Conjunctiva/sclera: Conjunctivae normal.     Pupils: Pupils are equal, round, and reactive to light.  Neck:     Musculoskeletal: Normal range of motion and neck supple.     Trachea: Phonation normal.  Cardiovascular:     Rate and Rhythm: Normal rate and regular rhythm.     Heart sounds: Normal heart sounds.  Pulmonary:     Effort: Pulmonary effort is normal.     Breath sounds: Normal breath sounds.  Abdominal:     Palpations: Abdomen is soft.     Tenderness: There is no abdominal tenderness.  Musculoskeletal: Normal range of motion.     Comments: While lying supine on stretcher at 45 degrees, patient has her head lifted up.  Skin:    General: Skin is warm and dry.  Neurological:     Mental Status: She is alert.     Motor: No abnormal muscle tone.     Comments: She is nonverbal.  Her eyes are open and she gazes about.  She does not resist examination.  No focal asymmetry of strength.  Psychiatric:        Mood and Affect: Mood normal.        Behavior: Behavior normal.      ED Treatments / Results  Labs (all labs ordered are listed, but only abnormal results are displayed) Labs Reviewed  CBG MONITORING, ED - Abnormal; Notable for the following components:      Result Value   Glucose-Capillary 115 (*)    All other components within normal limits  CBC WITH DIFFERENTIAL/PLATELET  I-STAT CHEM 8, ED  TROPONIN I (HIGH SENSITIVITY)    EKG EKG Interpretation  Date/Time:  2018/12/21 14:25:40 EDT Ventricular Rate:  79 PR Interval:    QRS Duration: 77 QT Interval:  376 QTC Calculation: 431 R Axis:   55 Text Interpretation:  Sinus rhythm Low voltage, precordial leads No old tracing to compare Confirmed by Daleen Bo [phone]) on 12/21/2018 4:15:56 PM   Radiology Dg Cervical Spine 2 Or 3 Views  Result Date: 12/19/2018 CLINICAL DATA:  Neck and upper back pain. EXAM: CERVICAL SPINE - 2-3 VIEW COMPARISON:  Thoracic spine series  performed today FINDINGS: Severely limited study by patient positioning. No definite visible fracture or subluxation. Prevertebral soft tissues are normal. IMPRESSION: Severely limited study due to patient's condition/positioning. If there is high clinical concern for cervical spine abnormality, consider CT or MRI. Electronically  Signed   By: Charlett Nose M.D.   On: 12/19/2018 14:01   Dg Thoracic Spine 2 View  Result Date: 12/19/2018 CLINICAL DATA:  Acute midline back pain EXAM: THORACIC SPINE 2 VIEWS COMPARISON:  None. FINDINGS: No fracture or focal bone lesion.  Diffuse osteopenia. IMPRESSION: No acute bony abnormality. Electronically Signed   By: Charlett Nose M.D.   On: 12/19/2018 14:01    Procedures .Critical Care Performed by: Mancel Bale, MD Authorized by: Mancel Bale, MD   Critical care provider statement:    Critical care time (minutes):  35   Critical care start time:  01/01/19 5:20 PM   Critical care end time:  01/01/2019 5:55 PM   Critical care time was exclusive of:  Separately billable procedures and treating other patients   Critical care was necessary to treat or prevent imminent or life-threatening deterioration of the following conditions:  Cardiac failure   Critical care was time spent personally by me on the following activities:  Blood draw for specimens, development of treatment plan with patient or surrogate, discussions with consultants, evaluation of patient's response to treatment, examination of patient, obtaining history from patient or surrogate, ordering and performing treatments and interventions, ordering and review of laboratory studies, pulse oximetry, re-evaluation of patient's condition, review of old charts and ordering and review of radiographic studies   (including critical care time)  Medications Ordered in ED Medications  EPINEPHrine (ADRENALIN) 1 MG/10ML injection (0.1 mg Intravenous Given Jan 01, 2019 1739)  lidocaine (cardiac) 2000 mg in dextrose  5% 500 mL ( /mL) IV infusion ( Intravenous Stopped January 01, 2019 1745)  atropine injection (1 mg Intravenous Given 01/01/2019 1725)     Initial Impression / Assessment and Plan / ED Course  I have reviewed the triage vital signs and the nursing notes.  Pertinent labs & imaging results that were available during my care of the patient were reviewed by me and considered in my medical decision making (see chart for details).  Clinical Course as of Dec 20 1751  Sat 01-Jan-2019  1747 I discussed the case with Gennette Pac, nurse practitioner at [facility].  She states that her primary care provider at the office will sign the death certificate.   [EW]    Clinical Course User Index [EW] Mancel Bale, MD        Patient Vitals for the past 24 hrs:  BP Temp Temp src Pulse Resp SpO2  01/01/2019 1530 120/75 - - - 14 -  01/01/19 1515 - - - - (!) 22 -  2019/01/01 1500 119/61 - - - (!) 21 -  2019-01-01 1445 - - - - 20 -  January 01, 2019 1430 116/75 - - 63 18 98 %  Jan 01, 2019 1428 123/70 (!) 97.5 F (36.4 C) Oral 74 18 98 %    5:06 PM Reevaluation with update and discussion. After initial assessment and treatment, an updated evaluation reveals patient is resting comfortably.  She tolerated applesauce and a full cup of ginger ale and some graham crackers.  Husband continues to assert that she is at her baseline.  Findings discussed and questions answered. Mancel Bale   Medical Decision Making: Patient was initially evaluated by me, at 3:05 PM.  At that time she was reported to have improved to her baseline by her husband who was with her.  He requested that she eat and drink something which was offered.  The patient was observed for 2 and half hours, 5:10 PM, without alteration in vital signs.  As the nurse arrived to her room to check discharge vitals, the patient was found on the floor.  Her husband was with her and states that he had been in the hallway, when he heard her fall.  He  feels that she struck her head on the cabinet when she fell.  Patient was placed into bed, and found to be apneic.  I immediately arrived in the room and began CPR.  Cardiopulmonary Resuscitation (CPR) Procedure Note Directed/Performed by: Mancel Bale I personally directed ancillary staff and/or performed CPR in an effort to regain return of spontaneous circulation and to maintain cardiac, neuro and systemic perfusion.   ACLS protocol followed, cardiac compressions, facemask breathing with bag-valve-mask.  She was given multiple doses of epinephrine, followed by lidocaine for PVCs, then atropine for bradycardia.  During the code I confirmed with the husband that he did not want her intubated.  He states that he is her power of attorney.  Patient was coded for 15 minutes, at the end of which she had PEA, rate of 20, and was apneic.  Time of death 82.   CRITICAL CARE-yes Performed by: Mancel Bale   Nursing Notes Reviewed/ Care Coordinated Applicable Imaging Reviewed Interpretation of Laboratory Data incorporated into ED treatment   Death in ED   Final Clinical Impressions(s) / ED Diagnoses   Final diagnoses:  Transient alteration of awareness  Cardiac arrest [facility])    ED Discharge Orders    None       Mancel Bale, MD 01/01/2019 Rickey Primus

## 2019-01-18 NOTE — Code Documentation (Signed)
Patient time of death occurred at 87 by Dr. Eulis Foster.

## 2019-01-18 NOTE — ED Notes (Signed)
Patient spouse leaving room repeatedly.  This nurse asked spouse to stay in room with patient for safety and [place] precautions.  Patient gave verbal agreement.

## 2019-01-18 NOTE — ED Notes (Signed)
Pt to be taken to morgue with security

## 2019-01-18 NOTE — ED Notes (Signed)
Family member from room 37 yelled out nurses station that pt had fallen on the floor. This RN, Charge RN Shirlean Mylar and Gaspar Bidding RN went into room to assist pt. Pt noted to be unresponsive with agonal respirations and with with no pulse. Pt drooling. Pt assisted to bed. Began bagging pt and CPR initiated. EDP and RT notified and at bedside.

## 2019-01-18 NOTE — ED Triage Notes (Signed)
Pt seen by PCP yesterday due to holding neck down x 4 days. Given voltaren at 7 pm. Pt was normal last night but pt has not moved from recliner since and has not talked since. Pt does have hx of dementia, but normally walks and talked. Pt HR was initially in the 40's and was given atropine and 300 cc with HR up to 60's

## 2019-01-18 DEATH — deceased

## 2019-06-11 ENCOUNTER — Ambulatory Visit: Payer: Medicare HMO | Admitting: Family Medicine

## 2020-01-05 IMAGING — DX DG THORACIC SPINE 2V
3 series · 3 of 3 positions shown · non-contrast
Comparison: None.

CLINICAL DATA: Acute midline back pain

EXAM:
THORACIC SPINE 2 VIEWS

[t-spine ap]
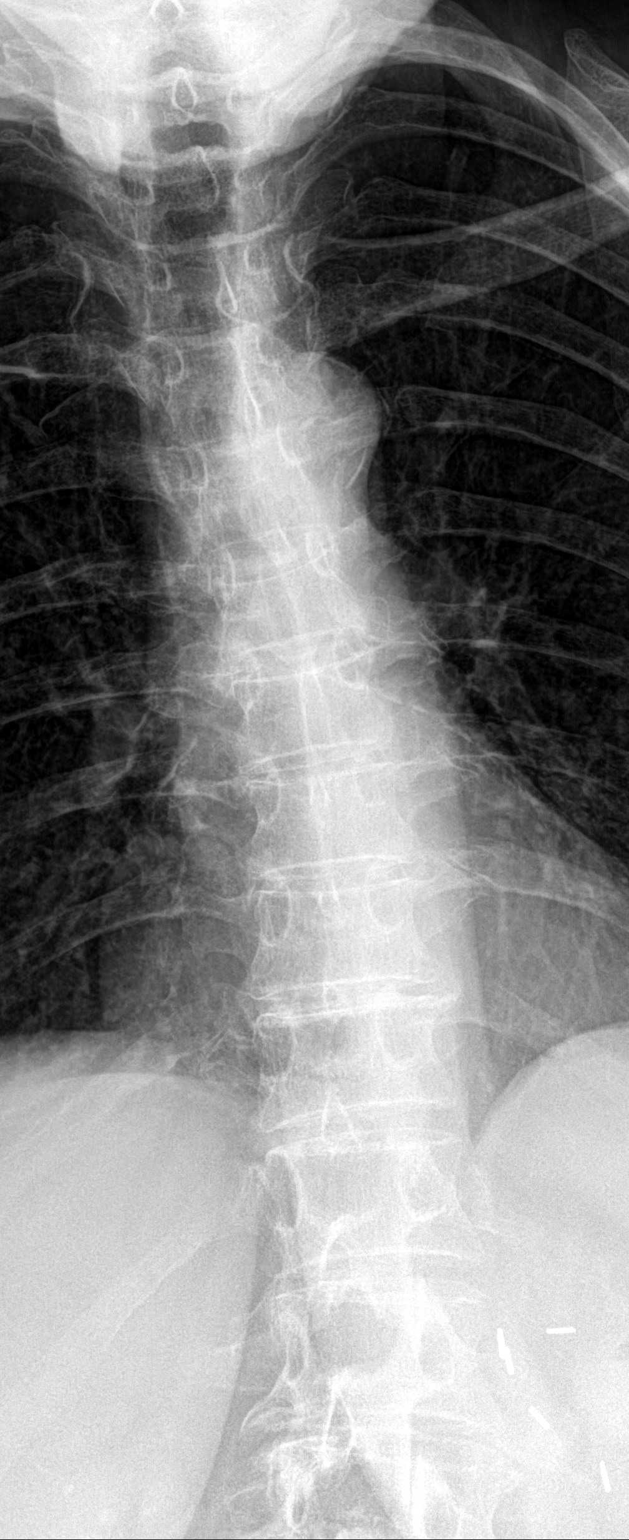

[t-spine lat]
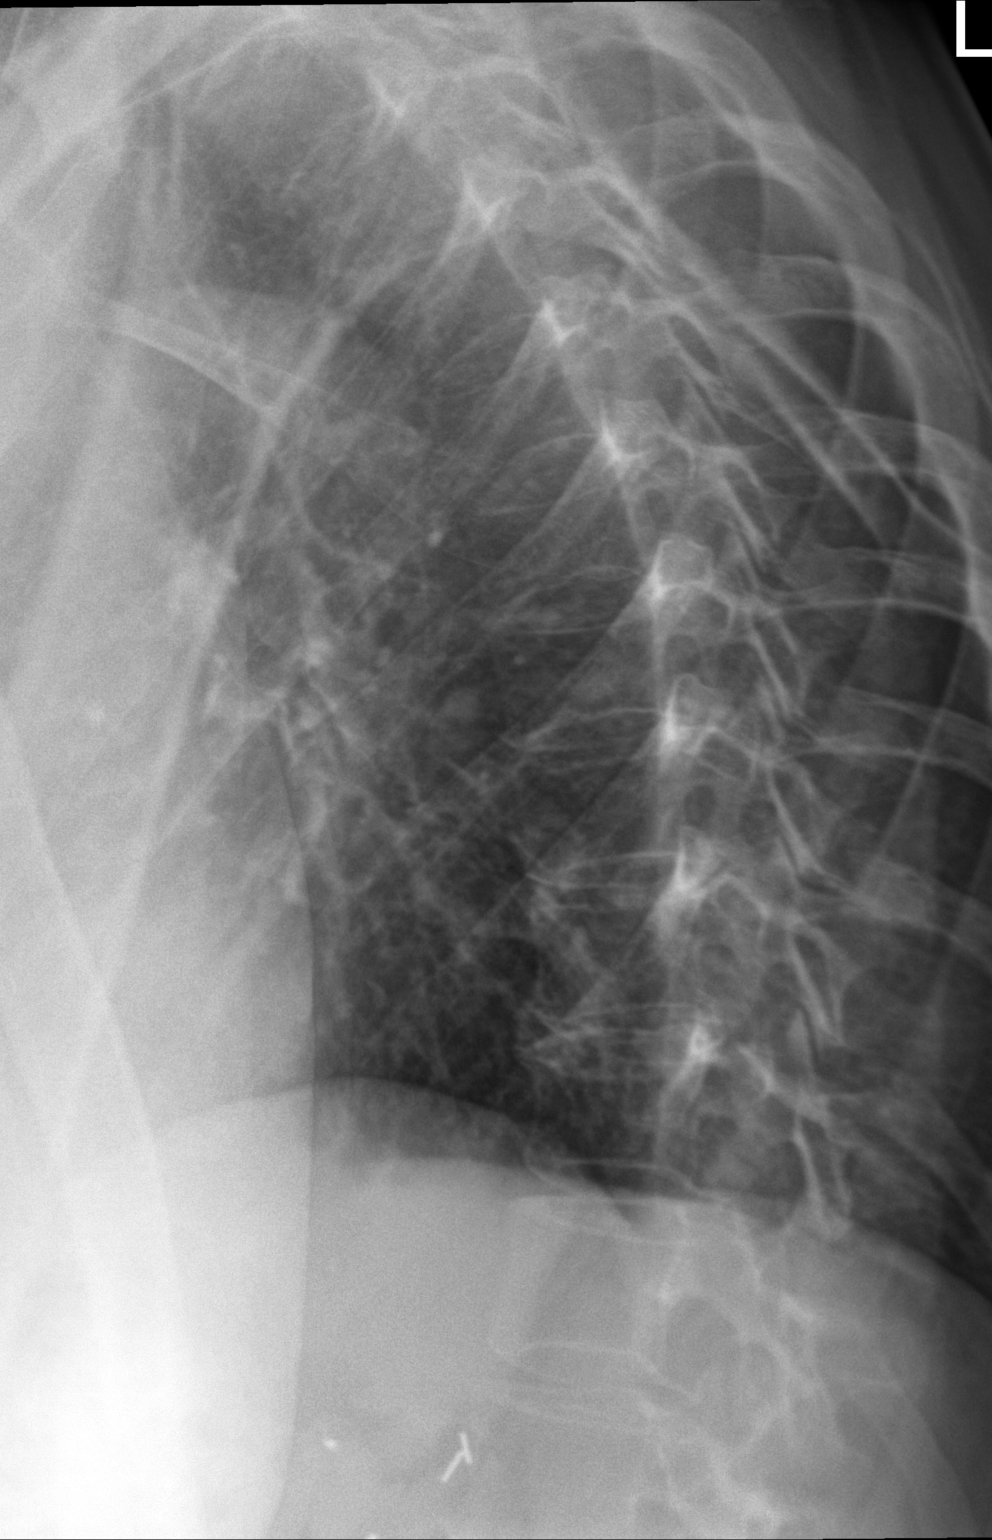

[t-spine lat swimmers]
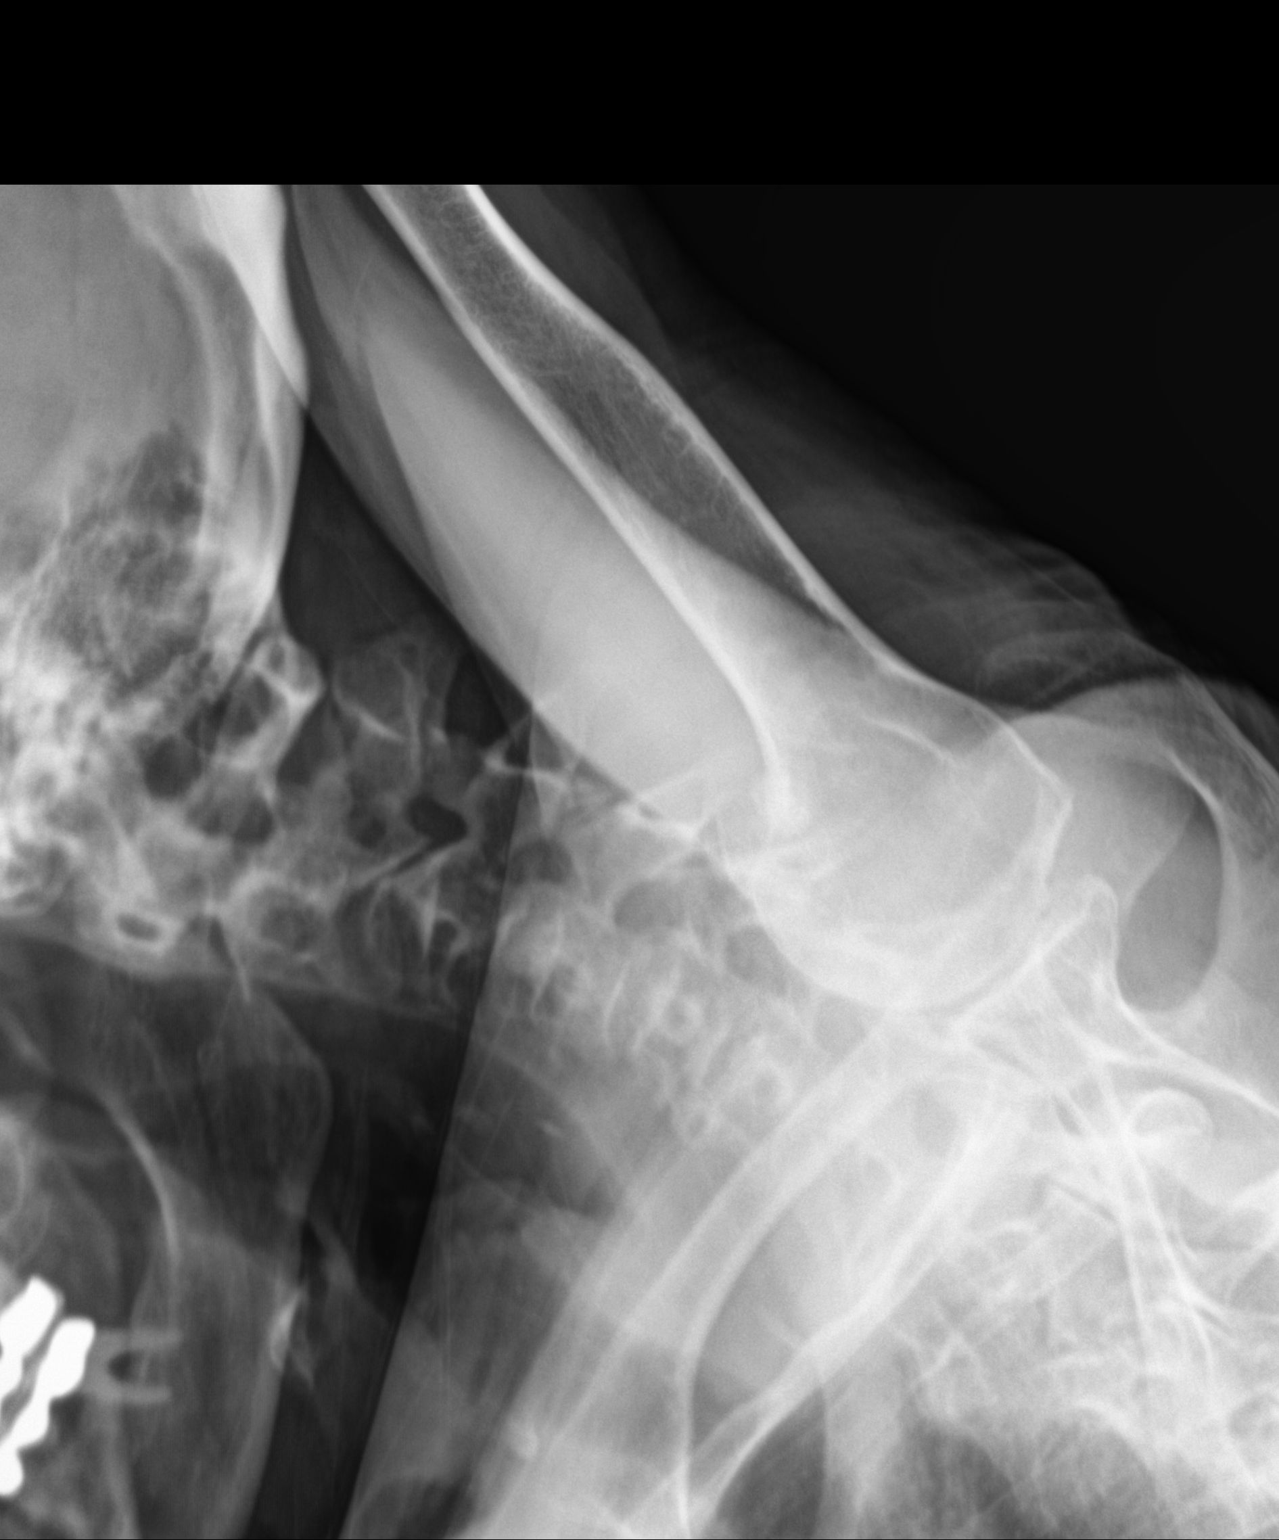

[3 of 3 positions shown; findings below may reference images not displayed]

FINDINGS: No fracture or focal bone lesion.  Diffuse osteopenia.
IMPRESSION: No acute bony abnormality.
